# Patient Record
Sex: Female | Born: 1970
Health system: Southern US, Academic
[De-identification: ages and names within clinical notes are randomized; demographics above are authoritative.]

## PROBLEM LIST (undated history)

## (undated) ENCOUNTER — Encounter

## (undated) ENCOUNTER — Telehealth

## (undated) ENCOUNTER — Ambulatory Visit

## (undated) ENCOUNTER — Ambulatory Visit: Attending: Pharmacist | Primary: Pharmacist

## (undated) DIAGNOSIS — E785 Hyperlipidemia, unspecified: Secondary | ICD-10-CM

## (undated) DIAGNOSIS — G43909 Migraine, unspecified, not intractable, without status migrainosus: Secondary | ICD-10-CM

## (undated) DIAGNOSIS — T8859XA Other complications of anesthesia, initial encounter: Secondary | ICD-10-CM

## (undated) DIAGNOSIS — K219 Gastro-esophageal reflux disease without esophagitis: Secondary | ICD-10-CM

## (undated) DIAGNOSIS — M1990 Unspecified osteoarthritis, unspecified site: Secondary | ICD-10-CM

## (undated) DIAGNOSIS — M069 Rheumatoid arthritis, unspecified: Secondary | ICD-10-CM

## (undated) DIAGNOSIS — R569 Unspecified convulsions: Secondary | ICD-10-CM

## (undated) DIAGNOSIS — Z720 Tobacco use: Secondary | ICD-10-CM

## (undated) DIAGNOSIS — E039 Hypothyroidism, unspecified: Secondary | ICD-10-CM

## (undated) DIAGNOSIS — N83209 Unspecified ovarian cyst, unspecified side: Secondary | ICD-10-CM

## (undated) DIAGNOSIS — F429 Obsessive-compulsive disorder, unspecified: Secondary | ICD-10-CM

## (undated) HISTORY — PX: DIAGNOSTIC LAPAROSCOPY: SUR761

## (undated) HISTORY — PX: KNEE ARTHROSCOPY: SHX127

## (undated) HISTORY — PX: CRYOABLATION: SHX1415

## (undated) HISTORY — PX: REFRACTIVE SURGERY: SHX103

## (undated) HISTORY — PX: COLONOSCOPY: SHX174

## (undated) HISTORY — PX: SHOULDER SURGERY: SHX246

## (undated) HISTORY — PX: TONSILLECTOMY: SUR1361

## (undated) HISTORY — PX: ABDOMINAL HYSTERECTOMY: SHX81

## (undated) HISTORY — PX: ESOPHAGOGASTRODUODENOSCOPY: SHX1529

## (undated) HISTORY — PX: CHOLECYSTECTOMY: SHX55

---

## 2011-03-11 ENCOUNTER — Ambulatory Visit: Payer: Self-pay | Admitting: Neurology

## 2011-04-25 ENCOUNTER — Ambulatory Visit: Payer: Self-pay | Admitting: Neurology

## 2011-07-28 ENCOUNTER — Ambulatory Visit: Payer: Self-pay | Admitting: Unknown Physician Specialty

## 2012-07-13 ENCOUNTER — Ambulatory Visit: Payer: Self-pay

## 2012-08-07 ENCOUNTER — Ambulatory Visit: Payer: Self-pay | Admitting: Unknown Physician Specialty

## 2012-10-14 ENCOUNTER — Ambulatory Visit: Payer: Self-pay

## 2013-04-13 ENCOUNTER — Ambulatory Visit: Payer: Self-pay | Admitting: Family Medicine

## 2013-08-29 IMAGING — CT CT ABD-PELV W/ CM
1 of 2 series · 15 of 32 positions shown, 19 images · IV contrast (isovue)
Comparison: none

REASON FOR EXAM: Chronic Diarrhea  Abn Abd Xray
COMMENTS:

PROCEDURE:     KCT - KCT ABDOMEN/PELVIS W  - July 13, 2012  [DATE]
RESULT:     Technique: Helical 3 mm sections were obtained from the lung
bases through the superior iliac crest status post intravenous
administration of 85mL Isovue 370.

[Series 2: abd 3mm w 3.0 i40f 3 · axial · 0.82mm/px · z∈[-1029,-576]mm · 15 of 165 slices shown, 19 images]
[im 7/165  soft-tissue]
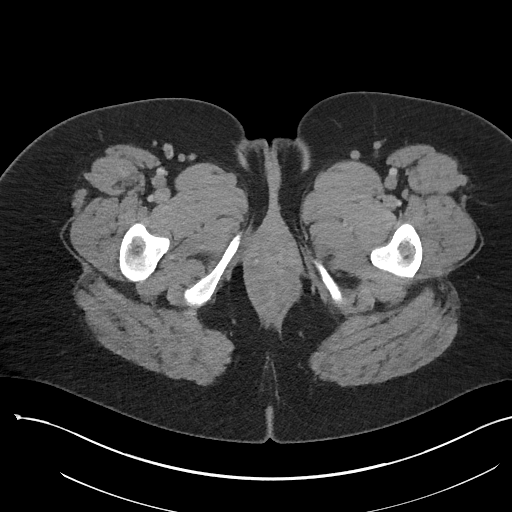
[im 7/165  bone]
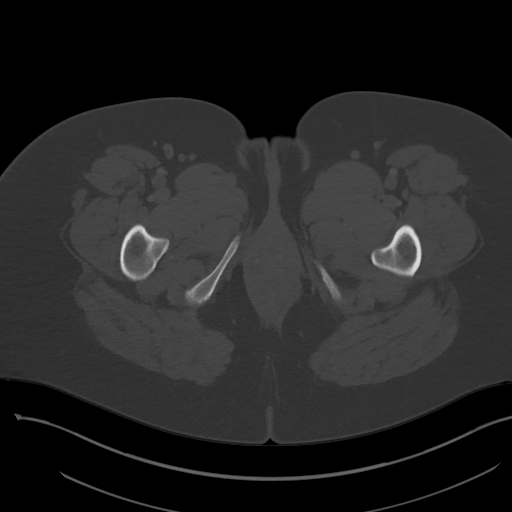
[im 19/165  soft-tissue]
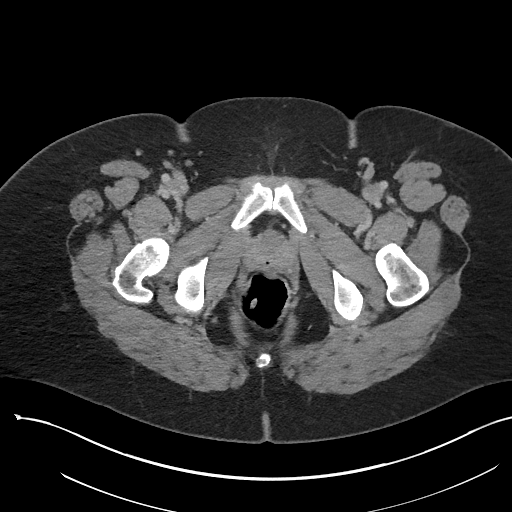
[im 32/165  soft-tissue]
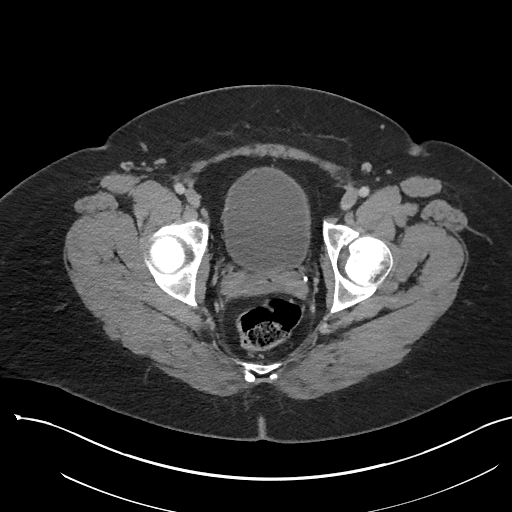
[im 45/165  soft-tissue]
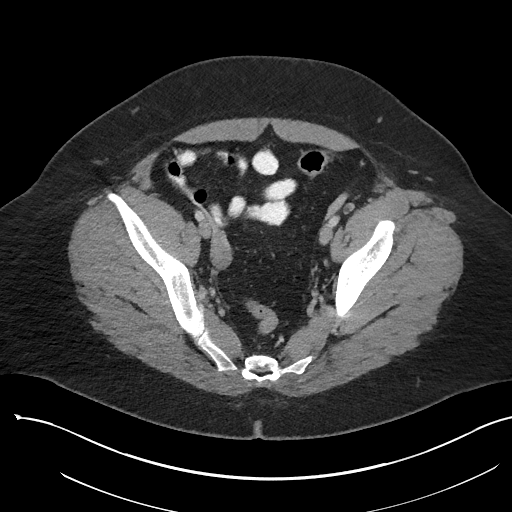
[im 57/165  soft-tissue]
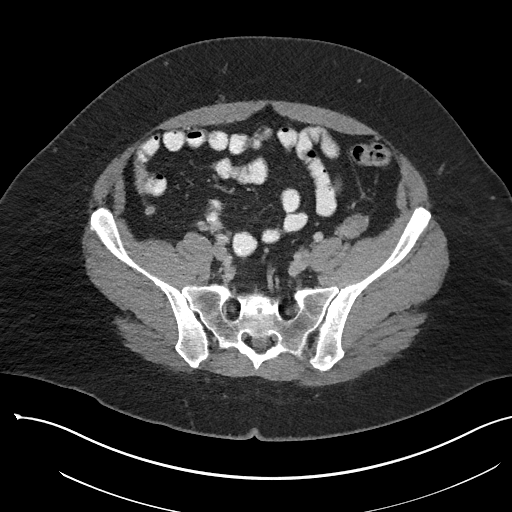
[im 70/165  soft-tissue]
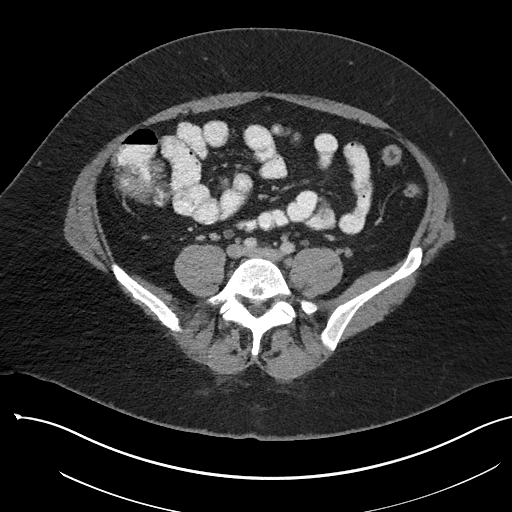
[im 83/165  soft-tissue]
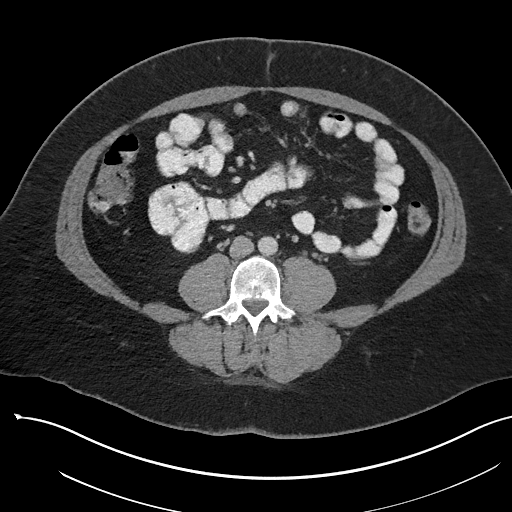
[im 95/165  soft-tissue]
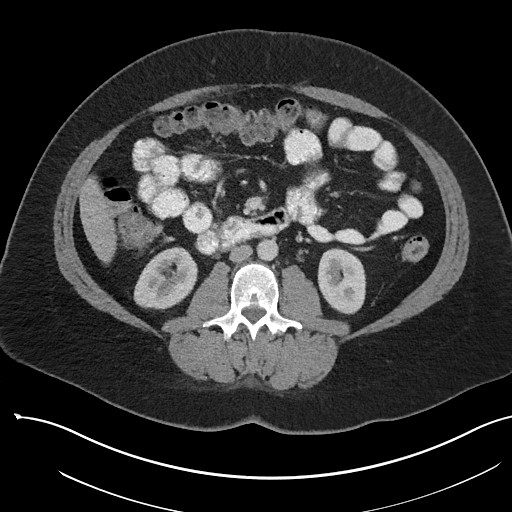
[im 108/165  soft-tissue]
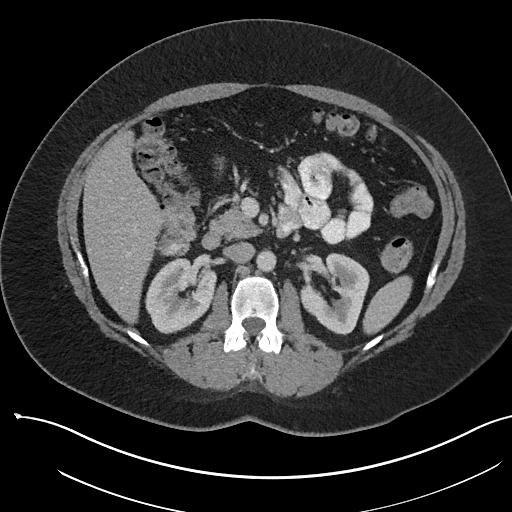
[im 108/165  bone]
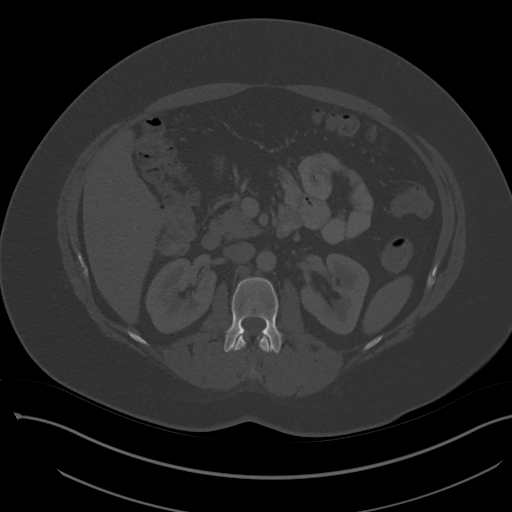
[im 120/165  soft-tissue]
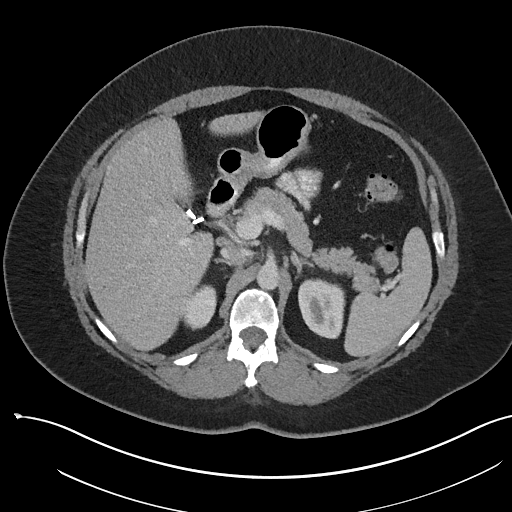
[im 133/165  soft-tissue]
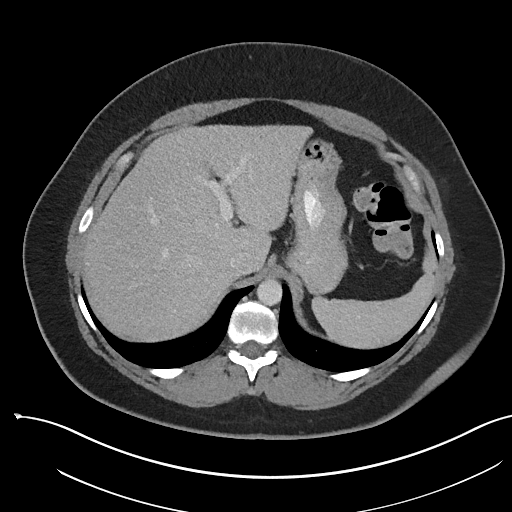
[im 139/165  lung]
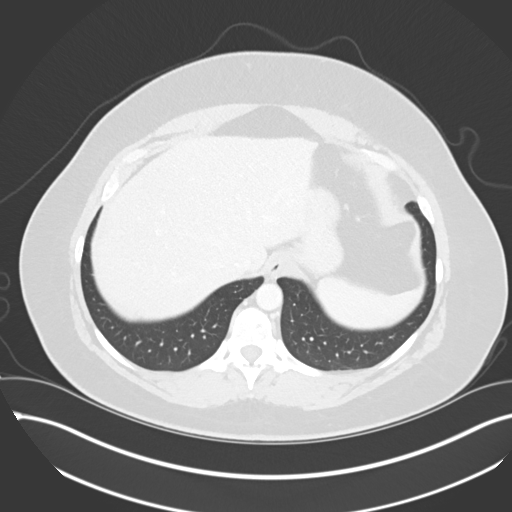
[im 146/165  soft-tissue]
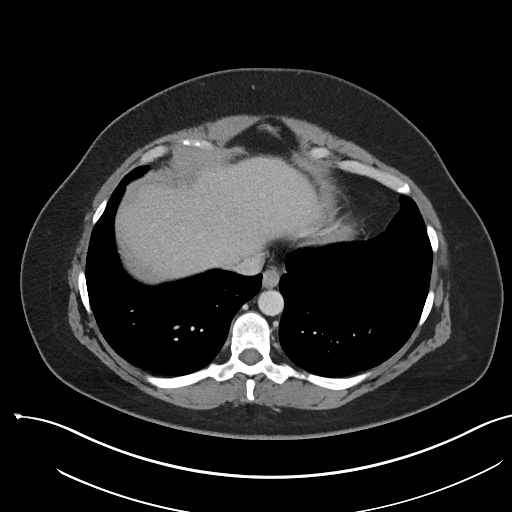
[im 146/165  lung]
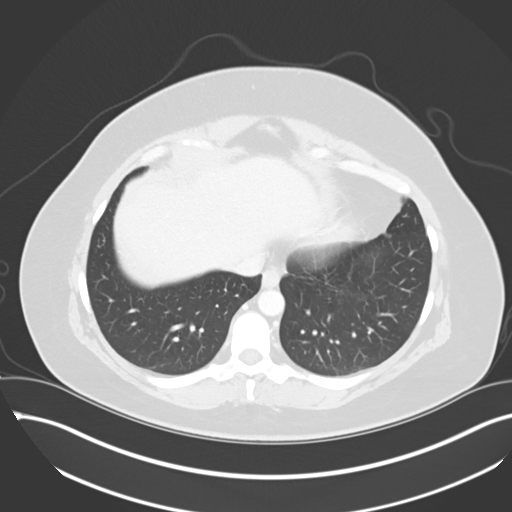
[im 152/165  lung]
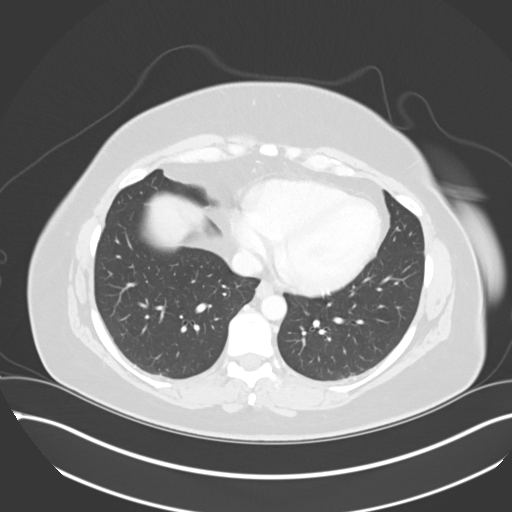
[im 158/165  soft-tissue]
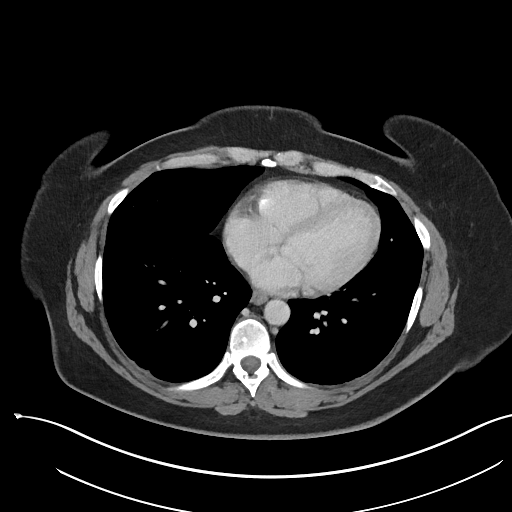
[im 158/165  lung]
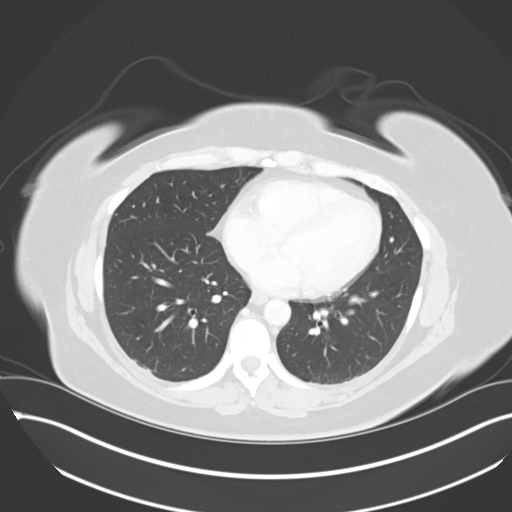

[15 of 32 positions shown; findings below may reference images not displayed]

FINDINGS: The lung bases are unremarkable.

The liver, spleen, adrenals, pancreas, and kidneys are unremarkable.

There is not evidence of bowel obstruction, enteritis, colitis nor
diverticulitis.

There is not evidence of an abdominal aortic aneurysm. The celiac, SMA, IMA,
portal vein, and SMV are opacified. The appendix is identified and is
unremarkable. Stomach is now completely decompressed and some wall
thickening cannot be excluded clinically warranted further evaluation with
direct visualization recommended.

Surgical clips are identified within the gallbladder fossa consistent with
prior cholecystectomy.

There is no evidence of abdominal free fluid, loculated fluid collections,
masses nor adenopathy.
IMPRESSION: No CT evidence of obstructive or inflammatory
abnormalities.

2. Findings within the stomach as described above.

## 2013-11-25 ENCOUNTER — Ambulatory Visit: Payer: Self-pay | Admitting: Family Medicine

## 2013-11-25 LAB — RAPID STREP-A WITH REFLX: Micro Text Report: NEGATIVE

## 2013-11-28 LAB — BETA STREP CULTURE(ARMC)

## 2022-03-07 MED ORDER — RINVOQ 15 MG TABLET,EXTENDED RELEASE
ORAL_TABLET | Freq: Every day | ORAL | 11 refills | 0 days
Start: 2022-03-07 — End: ?

## 2022-03-30 DIAGNOSIS — M0609 Rheumatoid arthritis without rheumatoid factor, multiple sites: Principal | ICD-10-CM

## 2022-04-01 NOTE — Unmapped (Signed)
[facility] Specialty Medication Onboarding    Specialty Medication: RINVOQ 15 mg Tb24 (upadacitinib)  Prior Authorization: Approved   Financial Assistance: Yes - copay card approved as secondary   Final Copay/Day Supply: $5 / 30    Insurance Restrictions: Yes - max 1 month supply     Notes to Pharmacist: n/a    The triage team has completed the benefits investigation and has determined that the patient is able to fill this medication at [facility]. Please contact the patient to complete the onboarding or follow up with the prescribing physician as needed.

## 2022-04-04 NOTE — Unmapped (Signed)
[facility]   Patient Onboarding/Medication Counseling    Roberta Bird is a 51 y.o. female with RA who I am counseling today on continuation of therapy.  I am speaking to the patient.    Was a Nurse, learning disability used for this call? No    Verified patient's date of birth / HIPAA.    Specialty medication(s) to be sent: Inflammatory Disorders: Rinvoq      Non-specialty medications/supplies to be sent: none at this time      Medications not needed at this time: n/a         Rinvoq (upadacitinib)    The patient declined counseling on medication administration, missed dose instructions, goals of therapy, side effects and monitoring parameters, warnings and precautions, drug/food interactions, and storage, handling precautions, and disposal because they have taken the medication previously. The information in the declined sections below are for informational purposes only and was not discussed with patient.       Medication & Administration     Dosage: Rheumatoid arthritis: Take 15mg  by mouth one time daily    Lab tests required prior to treatment initiation:  Tuberculosis: Tuberculosis screening resulted in a non-reactive Quantiferon TB Gold assay. (05/05/21)  Hepatitis B: Hepatitis B serology studies are not documented in the patient's chart but medication prescriber has indicated they are aware and wishing to initiate treatment at this time.  Absolute lymphocyte count: ALC greater than 500/mm3.  Absolute neutrophil count: ANC greater than 1,000/mm3.  Hemoglobin: Hemoglobin is greater than 8 g/dL.  Liver function tests: Baseline ALT and AST were evaluated and are documented in the patient chart prior to treatment initiation.  Pregnancy: Pregnancy status unconfirmed in patient chart but medication prescriber has indicated they are aware and wishing to initiate treatment at this time.      Administration: Take tablets with or without food.  Swallow tablets whole; do not chew, break, or crush.    Adherence/Missed dose instructions:  Take a missed dose as soon as you think about it.  If it has been more than 8-12 hours since your normal dosing time, skip the missed dose and go back to your normal time the following day.  Never take 2 doses at the same time or extra doses in an attempt to 'catch up' after a missed dose.    Goals of Therapy     Achieve symptom remission  Slow disease progression  Protection of remaining articular structures  Maintenance of function  Maintenance of effective psychosocial functioning    Side Effects & Monitoring Parameters     Upset stomach  Signs of a common cold - minor sore throat, runny or stuffy nose, etc.    The following side effects should be reported to the provider:  Signs of a hypersensitivity reaction - rash; hives; itching; red, swollen, blistered, or peeling skin; wheezing; tightness in the chest or throat; difficulty breathing, swallowing, or talking; swelling of the mouth, face, lips, tongue, or throat; etc.  Reduced immune function - report signs of infection such as fever; chills; body aches; very bad sore throat; ear or sinus pain; cough; more sputum or change in color of sputum; pain with passing urine; wound that will not heal, etc.  Also at a slightly higher risk of some malignancies (mainly skin and blood cancers) due to this reduced immune function.  A new skin growth or lump  Stomach pain that is new or worse or change in bowel habits  Signs of a blood clot -  chest pain or pressure; coughing up blood; shortness of breath; swelling, warmth, numbness, or pain in a leg or arm      Contraindications, Warnings, & Precautions     Have your bloodwork checked as you have been told by your prescriber  Talk with your doctor if you are pregnant, planning to become pregnant, or breastfeeding - women taking this medication must use birth control while taking this drug and for some time after the last dose  Discuss the possible need for holding your dose(s) of Rinvoq?? when a planned procedure is scheduled with the prescriber as it may delay healing/recovery timeline       Drug/Food Interactions     Medication list reviewed in Epic. The patient was instructed to inform the care team before taking any new medications or supplements. No drug interactions identified.   Talk with you prescriber or pharmacist before receiving any live vaccinations while taking this medication and after you stop taking it    Storage, Handling Precautions, & Disposal     Store in the original container at room temperature and protect from moisture.      Current Medications (including OTC/herbals), Comorbidities and Allergies     Current Outpatient Medications   Medication Sig Dispense Refill    upadacitinib (RINVOQ) 15 mg Tb24 Take 1 tablet (15 mg total) by mouth daily. 30 tablet 11     No current facility-administered medications for this visit.       Not on File    There is no problem list on file for this patient.      Reviewed and up to date in Epic.    Appropriateness of Therapy     Acute infections noted within Epic:  No active infections  Patient reported infection: None    Is medication and dose appropriate based on diagnosis and infection status? Yes    Prescription has been clinically reviewed: Yes      Baseline Quality of Life Assessment      How many days over the past month did your RA  keep you from your normal activities? For example, brushing your teeth or getting up in the morning. Patient declined to answer    Financial Information     Medication Assistance provided: Prior Authorization and Copay Assistance    Anticipated copay of $5 (30 days) reviewed with patient. Verified delivery address.    Delivery Information     Scheduled delivery date: 04/06/22    Expected start date: 04/08/22    Medication will be delivered via Next Day Courier to the prescription address in [facility].  This shipment will not require a signature.      Explained the services we provide at [facility] and that each month we would call to set up refills.  Stressed importance of returning phone calls so that we could ensure they receive their medications in time each month.  Informed patient that we should be setting up refills 7-10 days prior to when they will run out of medication.  A pharmacist will reach out to perform a clinical assessment periodically.  Informed patient that a welcome packet, containing information about our pharmacy and other support services, a Notice of Privacy Practices, and a drug information handout will be sent.      The patient or caregiver noted above participated in the development of this care plan and knows that they can request review of or adjustments to the care plan at any time.  Patient or caregiver verbalized understanding of the above information as well as how to contact the pharmacy at [phone] option 4 with any questions/concerns.  The pharmacy is open Monday through Friday 8:30am-4:30pm.  A pharmacist is available 24/7 via pager to answer any clinical questions they may have.    Patient Specific Needs     Does the patient have any physical, cognitive, or cultural barriers? No    Does the patient have adequate living arrangements? (i.e. the ability to store and take their medication appropriately) Yes    Did you identify any home environmental safety or security hazards? No    Patient prefers to have medications discussed with  Patient     Is the patient or caregiver able to read and understand education materials at a high school level or above? Yes    Patient's primary language is  English     Is the patient high risk? No    SOCIAL DETERMINANTS OF HEALTH     At the [facility], we have learned that life circumstances - like trouble affording food, housing, utilities, or transportation can affect the health of many of our patients.   That is why we wanted to ask: are you currently experiencing any life circumstances that are negatively impacting your health and/or quality of life? Patient declined to answer    Social Determinants of Health     Financial Resource Strain: Not on file   Internet Connectivity: Not on file   Food Insecurity: Not on file   Tobacco Use: Not on file   Housing/Utilities: Not on file   Alcohol Use: Not on file   Transportation Needs: Not on file   Substance Use: Not on file   Health Literacy: Not on file   Physical Activity: Not on file   Interpersonal Safety: Not on file   Stress: Not on file   Intimate Partner Violence: Not on file   Depression: Not on file   Social Connections: Not on file       Would you be willing to receive help with any of the needs that you have identified today? Not applicable       Camillo Flaming, PharmD  [facility] Specialty Pharmacist

## 2022-04-05 MED FILL — RINVOQ 15 MG TABLET,EXTENDED RELEASE: ORAL | 30 days supply | Qty: 30 | Fill #0

## 2022-04-25 DIAGNOSIS — U071 COVID-19: Secondary | ICD-10-CM

## 2022-04-25 HISTORY — DX: COVID-19: U07.1

## 2022-05-03 ENCOUNTER — Other Ambulatory Visit: Payer: Self-pay | Admitting: Podiatry

## 2022-05-03 NOTE — Unmapped (Signed)
[facility] Refill Coordination Note    Specialty Medication(s) to be Shipped:   Inflammatory Disorders: Rinvoq    Other medication(s) to be shipped: No additional medications requested for fill at this time     Roberta Bird, DOB: 26-Jul-1970  Phone: [phone] (home)       All above HIPAA information was verified with patient.     Was a Nurse, learning disability used for this call? No    Completed refill call assessment today to schedule patient's medication shipment from the [facility] [phone]).  All relevant notes have been reviewed.     Specialty medication(s) and dose(s) confirmed: Regimen is correct and unchanged.   Changes to medications: [place] reports no changes at this time.  Changes to insurance: No  New side effects reported not previously addressed with a pharmacist or physician: None reported  Questions for the pharmacist: No    Confirmed patient received a Conservation officer, historic buildings and a Surveyor, mining with first shipment. The patient will receive a drug information handout for each medication shipped and additional FDA Medication Guides as required.       DISEASE/MEDICATION-SPECIFIC INFORMATION        N/A    SPECIALTY MEDICATION ADHERENCE     Medication Adherence    Patient reported X missed doses in the last month: 0  Specialty Medication: Rinvoq  Patient is on additional specialty medications: No  Informant: patient                                Were doses missed due to medication being on hold? No    Rinvoq 15 mg: 4 days of medicine on hand       REFERRAL TO PHARMACIST     Referral to the pharmacist: Not needed      [facility]     Shipping address confirmed in Epic.     Delivery Scheduled: Yes, Expected medication delivery date: 1/18.     Medication will be delivered via Next Day Courier to the prescription address in Epic [facility].    Julianne Rice, PharmD   [facility] Specialty Pharmacist

## 2022-05-04 MED FILL — RINVOQ 15 MG TABLET,EXTENDED RELEASE: ORAL | 30 days supply | Qty: 30 | Fill #1

## 2022-05-11 ENCOUNTER — Encounter
Admission: RE | Admit: 2022-05-11 | Discharge: 2022-05-11 | Disposition: A | Discharge status: Home / Self Care | Payer: BC Managed Care – PPO | Source: Ambulatory Visit | Attending: Podiatry | Admitting: Podiatry

## 2022-05-11 HISTORY — DX: Other complications of anesthesia, initial encounter: T88.59XA

## 2022-05-11 HISTORY — DX: Obsessive-compulsive disorder, unspecified: F42.9

## 2022-05-11 HISTORY — DX: Rheumatoid arthritis, unspecified: M06.9

## 2022-05-11 HISTORY — DX: Unspecified ovarian cyst, unspecified side: N83.209

## 2022-05-11 HISTORY — DX: Tobacco use: Z72.0

## 2022-05-11 HISTORY — DX: Unspecified convulsions: R56.9

## 2022-05-11 HISTORY — DX: Hyperlipidemia, unspecified: E78.5

## 2022-05-11 HISTORY — DX: Gastro-esophageal reflux disease without esophagitis: K21.9

## 2022-05-11 HISTORY — DX: Unspecified osteoarthritis, unspecified site: M19.90

## 2022-05-11 HISTORY — DX: Hypothyroidism, unspecified: E03.9

## 2022-05-11 HISTORY — DX: Migraine, unspecified, not intractable, without status migrainosus: G43.909

## 2022-05-11 NOTE — Patient Instructions (Addendum)
Your procedure is scheduled on:05-20-22 Friday Report to the Registration Desk on the 1st floor of the [facility].Then proceed to the 2nd floor Surgery Desk To find out your arrival time, please call [phone] between 1PM - 3PM on:05-19-22 Thursday If your arrival time is 6:00 am, do not arrive prior to that time as the [facility] entrance doors do not open until 6:00 am.  REMEMBER: Instructions that are not followed completely may result in serious medical risk, up to and including death; or upon the discretion of your surgeon and anesthesiologist your surgery may need to be rescheduled.  Do not eat food after midnight the night before surgery.  No gum chewing, lozengers or hard candies.  You may however, drink CLEAR liquids up to 2 hours before you are scheduled to arrive for your surgery. Do not drink anything within 2 hours of your scheduled arrival time.  Clear liquids include: - water  - apple juice without pulp - gatorade (not RED colors) - black coffee or tea (Do NOT add milk or creamers to the coffee or tea) Do NOT drink anything that is not on this list  In addition, your doctor has ordered for you to drink the provided  Ensure Pre-Surgery Clear Carbohydrate Drink Drinking this carbohydrate drink up to two hours before surgery helps to reduce insulin resistance and improve patient outcomes. Please complete drinking 2 hours prior to scheduled arrival time.  TAKE THESE MEDICATIONS THE MORNING OF SURGERY WITH A SIP OF WATER: -levothyroxine (SYNTHROID)   One week prior to surgery: Stop Anti-inflammatories (NSAIDS) such as Advil, Aleve, Ibuprofen, Motrin, Naproxen, Naprosyn and Aspirin based products such as Excedrin, Goodys Powder, BC Powder.You may however, take Tylenol if needed for pain up until the day of surgery.  Stop ANY OVER THE COUNTER supplements/vitamins 7 days prior to surgery  No Alcohol for 24 hours before or after surgery.  No Smoking including e-cigarettes  for 24 hours prior to surgery.  No chewable tobacco products for at least 6 hours prior to surgery.  No nicotine patches on the day of surgery.  Do not use any "recreational" drugs for at least a week prior to your surgery.  Please be advised that the combination of cocaine and anesthesia may have negative outcomes, up to and including death. If you test positive for cocaine, your surgery will be cancelled.  On the morning of surgery brush your teeth with toothpaste and water, you may rinse your mouth with mouthwash if you wish. Do not swallow any toothpaste or mouthwash.  Use CHG Soap as directed on instruction sheet.  Do not wear jewelry, make-up, hairpins, clips or nail polish.  Do not wear lotions, powders, or perfumes.   Do not shave body from the neck down 48 hours prior to surgery just in case you cut yourself which could leave a site for infection.  Also, freshly shaved skin may become irritated if using the CHG soap.  Contact lenses, hearing aids and dentures may not be worn into surgery.  Do not bring valuables to the hospital. [facility] is not responsible for any missing/lost belongings or valuables.   Notify your doctor if there is any change in your medical condition (cold, fever, infection).  Wear comfortable clothing (specific to your surgery type) to the hospital.  After surgery, you can help prevent lung complications by doing breathing exercises.  Take deep breaths and cough every 1-2 hours. Your doctor may order a device called an Incentive Spirometer to help  you take deep breaths. When coughing or sneezing, hold a pillow firmly against your incision with both hands. This is called "splinting." Doing this helps protect your incision. It also decreases belly discomfort.  If you are being admitted to the hospital overnight, leave your suitcase in the car. After surgery it may be brought to your room.  If you are being discharged the day of surgery, you will not  be allowed to drive home. You will need a responsible adult (18 years or older) to drive you home and stay with you that night.   If you are taking public transportation, you will need to have a responsible adult (18 years or older) with you. Please confirm with your physician that it is acceptable to use public transportation.   Please call the [facility] at [phone] if you have any questions about these instructions.  Surgery Visitation Policy:  Patients undergoing a surgery or procedure may have two family members or support persons with them as long as the person is not COVID-19 positive or experiencing its symptoms.   Due to an increase in RSV and influenza rates and associated hospitalizations, children ages 80 and under will not be able to visit patients in [facility]. Masks continue to be strongly recommended.   How to Use an Incentive Spirometer An incentive spirometer is a tool that measures how well you are filling your lungs with each breath. Learning to take long, deep breaths using this tool can help you keep your lungs clear and active. This may help to reverse or lessen your chance of developing breathing (pulmonary) problems, especially infection. You may be asked to use a spirometer: After a surgery. If you have a lung problem or a history of smoking. After a long period of time when you have been unable to move or be active. If the spirometer includes an indicator to show the highest number that you have reached, your health care provider or respiratory therapist will help you set a goal. Keep a log of your progress as told by your health care provider. What are the risks? Breathing too quickly may cause dizziness or cause you to pass out. Take your time so you do not get dizzy or light-headed. If you are in pain, you may need to take pain medicine before doing incentive spirometry. It is harder to take a deep breath if you are having  pain. How to use your incentive spirometer  Sit up on the edge of your bed or on a chair. Hold the incentive spirometer so that it is in an upright position. Before you use the spirometer, breathe out normally. Place the mouthpiece in your mouth. Make sure your lips are closed tightly around it. Breathe in slowly and as deeply as you can through your mouth, causing the piston or the ball to rise toward the top of the chamber. Hold your breath for 3-5 seconds, or for as long as possible. If the spirometer includes a coach indicator, use this to guide you in breathing. Slow down your breathing if the indicator goes above the marked areas. Remove the mouthpiece from your mouth and breathe out normally. The piston or ball will return to the bottom of the chamber. Rest for a few seconds, then repeat the steps 10 or more times. Take your time and take a few normal breaths between deep breaths so that you do not get dizzy or light-headed. Do this every 1-2 hours when you are awake.  If the spirometer includes a goal marker to show the highest number you have reached (best effort), use this as a goal to work toward during each repetition. After each set of 10 deep breaths, cough a few times. This will help to make sure that your lungs are clear. If you have an incision on your chest or abdomen from surgery, place a pillow or a rolled-up towel firmly against the incision when you cough. This can help to reduce pain while taking deep breaths and coughing. General tips When you are able to get out of bed: Walk around often. Continue to take deep breaths and cough in order to clear your lungs. Keep using the incentive spirometer until your health care provider says it is okay to stop using it. If you have been in the hospital, you may be told to keep using the spirometer at home. Contact a health care provider if: You are having difficulty using the spirometer. You have trouble using the spirometer as  often as instructed. Your pain medicine is not giving enough relief for you to use the spirometer as told. You have a fever. Get help right away if: You develop shortness of breath. You develop a cough with bloody mucus from the lungs. You have fluid or blood coming from an incision site after you cough. Summary An incentive spirometer is a tool that can help you learn to take long, deep breaths to keep your lungs clear and active. You may be asked to use a spirometer after a surgery, if you have a lung problem or a history of smoking, or if you have been inactive for a long period of time. Use your incentive spirometer as instructed every 1-2 hours while you are awake. If you have an incision on your chest or abdomen, place a pillow or a rolled-up towel firmly against your incision when you cough. This will help to reduce pain. Get help right away if you have shortness of breath, you cough up bloody mucus, or blood comes from your incision when you cough. This information is not intended to replace advice given to you by your health care provider. Make sure you discuss any questions you have with your health care provider. Document Revised: 06/24/2019 Document Reviewed: 06/24/2019 Elsevier Patient Education  2023 [employer].

## 2022-05-20 ENCOUNTER — Ambulatory Visit: Payer: BC Managed Care – PPO | Admitting: Urgent Care

## 2022-05-20 ENCOUNTER — Ambulatory Visit: Payer: BC Managed Care – PPO

## 2022-05-20 ENCOUNTER — Ambulatory Visit
Admission: RE | Admit: 2022-05-20 | Discharge: 2022-05-20 | Disposition: A | Discharge status: Home / Self Care | Payer: BC Managed Care – PPO | Attending: Podiatry | Admitting: Podiatry

## 2022-05-20 ENCOUNTER — Other Ambulatory Visit: Payer: Self-pay

## 2022-05-20 ENCOUNTER — Encounter
Admission: RE | Disposition: A | Discharge status: Home / Self Care | Payer: Self-pay | Source: Home / Self Care | Attending: Podiatry

## 2022-05-20 ENCOUNTER — Encounter: Payer: Self-pay | Admitting: Podiatry

## 2022-05-20 DIAGNOSIS — F419 Anxiety disorder, unspecified: Secondary | ICD-10-CM | POA: Insufficient documentation

## 2022-05-20 DIAGNOSIS — Z6841 Body Mass Index (BMI) 40.0 and over, adult: Secondary | ICD-10-CM | POA: Insufficient documentation

## 2022-05-20 DIAGNOSIS — M7661 Achilles tendinitis, right leg: Secondary | ICD-10-CM | POA: Diagnosis present

## 2022-05-20 DIAGNOSIS — M0609 Rheumatoid arthritis without rheumatoid factor, multiple sites: Secondary | ICD-10-CM | POA: Diagnosis not present

## 2022-05-20 DIAGNOSIS — M1990 Unspecified osteoarthritis, unspecified site: Secondary | ICD-10-CM | POA: Insufficient documentation

## 2022-05-20 DIAGNOSIS — M899 Disorder of bone, unspecified: Secondary | ICD-10-CM | POA: Insufficient documentation

## 2022-05-20 DIAGNOSIS — Z87891 Personal history of nicotine dependence: Secondary | ICD-10-CM | POA: Insufficient documentation

## 2022-05-20 DIAGNOSIS — E039 Hypothyroidism, unspecified: Secondary | ICD-10-CM | POA: Insufficient documentation

## 2022-05-20 HISTORY — PX: ACHILLES TENDON SURGERY: SHX542

## 2022-05-20 HISTORY — PX: OSTECTOMY: SHX6439

## 2022-05-20 SURGERY — REPAIR, TENDON, ACHILLES
Anesthesia: General | Site: Foot | Laterality: Right

## 2022-05-20 MED ORDER — BUPIVACAINE-EPINEPHRINE 0.25% -1:200000 IJ SOLN
INTRAMUSCULAR | Status: DC | PRN
  Administered 2022-05-20: 10 mL

## 2022-05-20 MED ORDER — BUPIVACAINE HCL (PF) 0.25 % IJ SOLN
INTRAMUSCULAR | Status: AC
  Filled 2022-05-20: qty 60

## 2022-05-20 MED ORDER — PROPOFOL 10 MG/ML IV BOLUS
INTRAVENOUS | Status: AC
  Filled 2022-05-20: qty 20

## 2022-05-20 MED ORDER — 0.9 % SODIUM CHLORIDE (POUR BTL) OPTIME
TOPICAL | Status: DC | PRN
  Administered 2022-05-20: 1000 mL

## 2022-05-20 MED ORDER — MIDAZOLAM HCL 2 MG/2ML IJ SOLN
INTRAMUSCULAR | Status: AC
  Filled 2022-05-20: qty 2

## 2022-05-20 MED ORDER — EPINEPHRINE PF 1 MG/ML IJ SOLN
INTRAMUSCULAR | Status: AC
  Filled 2022-05-20: qty 1

## 2022-05-20 MED ORDER — OXYCODONE HCL 5 MG/5ML PO SOLN: 5.0000 mg | Freq: Once | ORAL | Status: DC | PRN

## 2022-05-20 MED ORDER — METOCLOPRAMIDE HCL 5 MG/ML IJ SOLN: 5.0000 mg | Freq: Three times a day (TID) | INTRAMUSCULAR | Status: DC | PRN

## 2022-05-20 MED ORDER — FAMOTIDINE 20 MG PO TABS
ORAL_TABLET | ORAL | Status: AC
  Administered 2022-05-20: 20 mg via ORAL
  Filled 2022-05-20: qty 1

## 2022-05-20 MED ORDER — OXYCODONE-ACETAMINOPHEN 5-325 MG PO TABS: 1.0000 | ORAL_TABLET | Freq: Four times a day (QID) | ORAL | 0 refills | Status: AC | PRN

## 2022-05-20 MED ORDER — FENTANYL CITRATE PF 50 MCG/ML IJ SOSY
PREFILLED_SYRINGE | INTRAMUSCULAR | Status: AC
  Administered 2022-05-20: 50 ug via INTRAVENOUS
  Filled 2022-05-20: qty 2

## 2022-05-20 MED ORDER — MIDAZOLAM HCL 2 MG/2ML IJ SOLN: 2.0000 mg | Freq: Once | INTRAMUSCULAR | Status: DC

## 2022-05-20 MED ORDER — FENTANYL CITRATE PF 50 MCG/ML IJ SOSY: 100.0000 ug | PREFILLED_SYRINGE | Freq: Once | INTRAMUSCULAR | Status: AC

## 2022-05-20 MED ORDER — LACTATED RINGERS IV SOLN: INTRAVENOUS | Status: DC

## 2022-05-20 MED ORDER — PROPOFOL 10 MG/ML IV BOLUS
INTRAVENOUS | Status: DC | PRN
  Administered 2022-05-20: 200 mg via INTRAVENOUS

## 2022-05-20 MED ORDER — STERILE WATER FOR INJECTION IJ SOLN
INTRAMUSCULAR | Status: AC
  Filled 2022-05-20: qty 10

## 2022-05-20 MED ORDER — DEXMEDETOMIDINE HCL IN NACL 200 MCG/50ML IV SOLN
INTRAVENOUS | Status: DC | PRN
  Administered 2022-05-20: 8 ug via INTRAVENOUS
  Administered 2022-05-20: 4 ug via INTRAVENOUS
  Administered 2022-05-20: 8 ug via INTRAVENOUS

## 2022-05-20 MED ORDER — CEFAZOLIN SODIUM-DEXTROSE 2-4 GM/100ML-% IV SOLN
2.0000 g | INTRAVENOUS | Status: AC
  Administered 2022-05-20: 2 g via INTRAVENOUS

## 2022-05-20 MED ORDER — ONDANSETRON HCL 4 MG PO TABS: 4.0000 mg | ORAL_TABLET | Freq: Four times a day (QID) | ORAL | Status: DC | PRN

## 2022-05-20 MED ORDER — LIDOCAINE HCL (PF) 2 % IJ SOLN
INTRAMUSCULAR | Status: AC
  Filled 2022-05-20: qty 5

## 2022-05-20 MED ORDER — BUPIVACAINE HCL (PF) 0.5 % IJ SOLN
INTRAMUSCULAR | Status: DC | PRN
  Administered 2022-05-20: 20 mL via PERINEURAL

## 2022-05-20 MED ORDER — METOCLOPRAMIDE HCL 10 MG PO TABS: 5.0000 mg | ORAL_TABLET | Freq: Three times a day (TID) | ORAL | Status: DC | PRN

## 2022-05-20 MED ORDER — MIDAZOLAM HCL 2 MG/2ML IJ SOLN
INTRAMUSCULAR | Status: DC | PRN
  Administered 2022-05-20: 2 mg via INTRAVENOUS

## 2022-05-20 MED ORDER — FENTANYL CITRATE (PF) 100 MCG/2ML IJ SOLN
INTRAMUSCULAR | Status: AC
  Filled 2022-05-20: qty 2

## 2022-05-20 MED ORDER — ACETAMINOPHEN 10 MG/ML IV SOLN: 1000.0000 mg | Freq: Once | INTRAVENOUS | Status: DC | PRN

## 2022-05-20 MED ORDER — CHLORHEXIDINE GLUCONATE 0.12 % MT SOLN
OROMUCOSAL | Status: AC
  Administered 2022-05-20: 15 mL via OROMUCOSAL
  Filled 2022-05-20: qty 15

## 2022-05-20 MED ORDER — ORAL CARE MOUTH RINSE: 15.0000 mL | Freq: Once | OROMUCOSAL | Status: AC

## 2022-05-20 MED ORDER — LIDOCAINE HCL (PF) 1 % IJ SOLN
INTRAMUSCULAR | Status: AC
  Filled 2022-05-20: qty 30

## 2022-05-20 MED ORDER — OXYCODONE HCL 5 MG PO TABS: 5.0000 mg | ORAL_TABLET | Freq: Once | ORAL | Status: DC | PRN

## 2022-05-20 MED ORDER — DOXYCYCLINE HYCLATE 100 MG PO CAPS: 100.0000 mg | ORAL_CAPSULE | Freq: Two times a day (BID) | ORAL | 0 refills | Status: AC

## 2022-05-20 MED ORDER — FENTANYL CITRATE (PF) 100 MCG/2ML IJ SOLN
INTRAMUSCULAR | Status: DC | PRN
  Administered 2022-05-20 (×2): 50 ug via INTRAVENOUS

## 2022-05-20 MED ORDER — CEFAZOLIN SODIUM-DEXTROSE 2-4 GM/100ML-% IV SOLN
INTRAVENOUS | Status: AC
  Filled 2022-05-20: qty 100

## 2022-05-20 MED ORDER — FENTANYL CITRATE (PF) 100 MCG/2ML IJ SOLN: 25.0000 ug | INTRAMUSCULAR | Status: DC | PRN

## 2022-05-20 MED ORDER — SUGAMMADEX SODIUM 500 MG/5ML IV SOLN
INTRAVENOUS | Status: DC | PRN
  Administered 2022-05-20: 300 mg via INTRAVENOUS

## 2022-05-20 MED ORDER — ROCURONIUM BROMIDE 10 MG/ML (PF) SYRINGE
PREFILLED_SYRINGE | INTRAVENOUS | Status: AC
  Filled 2022-05-20: qty 10

## 2022-05-20 MED ORDER — ONDANSETRON HCL 4 MG/2ML IJ SOLN
INTRAMUSCULAR | Status: DC | PRN
  Administered 2022-05-20: 4 mg via INTRAVENOUS

## 2022-05-20 MED ORDER — ROCURONIUM BROMIDE 100 MG/10ML IV SOLN
INTRAVENOUS | Status: DC | PRN
  Administered 2022-05-20: 50 mg via INTRAVENOUS

## 2022-05-20 MED ORDER — ONDANSETRON HCL 4 MG/2ML IJ SOLN: 4.0000 mg | Freq: Four times a day (QID) | INTRAMUSCULAR | Status: DC | PRN

## 2022-05-20 MED ORDER — DEXAMETHASONE SODIUM PHOSPHATE 10 MG/ML IJ SOLN
INTRAMUSCULAR | Status: AC
  Filled 2022-05-20: qty 1

## 2022-05-20 MED ORDER — DEXAMETHASONE SODIUM PHOSPHATE 4 MG/ML IJ SOLN
INTRAMUSCULAR | Status: DC | PRN
  Administered 2022-05-20: 2 mg via PERINEURAL

## 2022-05-20 MED ORDER — ONDANSETRON HCL 4 MG/2ML IJ SOLN
INTRAMUSCULAR | Status: AC
  Filled 2022-05-20: qty 2

## 2022-05-20 MED ORDER — ACETAMINOPHEN 10 MG/ML IV SOLN
INTRAVENOUS | Status: AC
  Filled 2022-05-20: qty 100

## 2022-05-20 MED ORDER — LIDOCAINE HCL (CARDIAC) PF 100 MG/5ML IV SOSY
PREFILLED_SYRINGE | INTRAVENOUS | Status: DC | PRN
  Administered 2022-05-20: 100 mg via INTRAVENOUS

## 2022-05-20 MED ORDER — CHLORHEXIDINE GLUCONATE 0.12 % MT SOLN: 15.0000 mL | Freq: Once | OROMUCOSAL | Status: AC

## 2022-05-20 MED ORDER — ONDANSETRON HCL 4 MG/2ML IJ SOLN: 4.0000 mg | Freq: Once | INTRAMUSCULAR | Status: DC | PRN

## 2022-05-20 MED ORDER — ACETAMINOPHEN 10 MG/ML IV SOLN
INTRAVENOUS | Status: DC | PRN
  Administered 2022-05-20: 1000 mg via INTRAVENOUS

## 2022-05-20 MED ORDER — FAMOTIDINE 20 MG PO TABS: 20.0000 mg | ORAL_TABLET | Freq: Once | ORAL | Status: AC

## 2022-05-20 SURGICAL SUPPLY — 71 items
ANCH SUT 4.5 FTPRNT PEEK-OPTM (Anchor) ×1 IMPLANT
ANCHOR 4.5 FOOTPRINT ULTRA (Anchor) IMPLANT
BIT DRILL 4X4.5 FOOTPRINT STR (BIT) IMPLANT
BLADE SURG 15 STRL LF DISP TIS (BLADE) ×1 IMPLANT
BLADE SURG 15 STRL SS (BLADE) ×3
BLADE SURG MINI STRL (BLADE) ×1 IMPLANT
BNDG CMPR 75X21 PLY HI ABS (MISCELLANEOUS)
BNDG CMPR STD VLCR NS LF 5.8X4 (GAUZE/BANDAGES/DRESSINGS) ×2
BNDG ELASTIC 4X5.8 VLCR NS LF (GAUZE/BANDAGES/DRESSINGS) ×2 IMPLANT
BNDG ESMARCH 4 X 12 STRL LF (GAUZE/BANDAGES/DRESSINGS) ×1
BNDG ESMARCH 4X12 STRL LF (GAUZE/BANDAGES/DRESSINGS) ×1 IMPLANT
BNDG ESMARCH 6 X 12 STRL LF (GAUZE/BANDAGES/DRESSINGS) ×1
BNDG ESMARCH 6X12 STRL LF (GAUZE/BANDAGES/DRESSINGS) ×1 IMPLANT
BNDG GAUZE DERMACEA FLUFF 4 (GAUZE/BANDAGES/DRESSINGS) ×1 IMPLANT
BNDG GZE 12X3 1 PLY HI ABS (GAUZE/BANDAGES/DRESSINGS) ×1
BNDG GZE DERMACEA 4 6PLY (GAUZE/BANDAGES/DRESSINGS) ×1
BNDG STRETCH GAUZE 3IN X12FT (GAUZE/BANDAGES/DRESSINGS) ×1 IMPLANT
BOOT STEPPER DURA MED (SOFTGOODS) IMPLANT
DRAPE FLUOR MINI C-ARM 54X84 (DRAPES) ×1 IMPLANT
DRILL 4X4.5 FOOTPRINT STR (BIT) ×1
DURAPREP 26ML APPLICATOR (WOUND CARE) ×1 IMPLANT
ELECT REM PT RETURN 9FT ADLT (ELECTROSURGICAL) ×1
ELECTRODE REM PT RTRN 9FT ADLT (ELECTROSURGICAL) ×1 IMPLANT
GAUZE SPONGE 4X4 12PLY STRL (GAUZE/BANDAGES/DRESSINGS) ×1 IMPLANT
GAUZE STRETCH 2X75IN STRL (MISCELLANEOUS) ×1 IMPLANT
GAUZE XEROFORM 1X8 LF (GAUZE/BANDAGES/DRESSINGS) ×1 IMPLANT
GLOVE BIO SURGEON STRL SZ7.5 (GLOVE) ×1 IMPLANT
GLOVE SURG UNDER LTX SZ8 (GLOVE) ×1 IMPLANT
GOWN STRL REUS W/ TWL XL LVL3 (GOWN DISPOSABLE) ×2 IMPLANT
GOWN STRL REUS W/TWL XL LVL3 (GOWN DISPOSABLE) ×2
HANDLE YANKAUER SUCT BULB TIP (MISCELLANEOUS) ×1 IMPLANT
IV NS 500ML (IV SOLUTION) ×1
IV NS 500ML BAXH (IV SOLUTION) ×1 IMPLANT
KIT TURNOVER KIT A (KITS) ×1 IMPLANT
LABEL OR SOLS (LABEL) IMPLANT
MANIFOLD NEPTUNE II (INSTRUMENTS) ×1 IMPLANT
NDL FILTER BLUNT 18X1 1/2 (NEEDLE) ×1 IMPLANT
NDL HYPO 18GX1.5 BLUNT FILL (NEEDLE) ×1 IMPLANT
NDL HYPO 25X1 1.5 SAFETY (NEEDLE) ×3 IMPLANT
NDL MAYO CATGUT SZ5 (NEEDLE) ×1
NDL SUT 5 .5 CRC TPR PNT MAYO (NEEDLE) ×1 IMPLANT
NEEDLE FILTER BLUNT 18X1 1/2 (NEEDLE) ×1 IMPLANT
NEEDLE HYPO 18GX1.5 BLUNT FILL (NEEDLE) ×1 IMPLANT
NEEDLE HYPO 25X1 1.5 SAFETY (NEEDLE) ×3 IMPLANT
NS IRRIG 500ML POUR BTL (IV SOLUTION) ×1 IMPLANT
PACK EXTREMITY ARMC (MISCELLANEOUS) ×1 IMPLANT
RASP SM TEAR CROSS CUT (RASP) IMPLANT
SPLINT CAST 1 STEP 5X30 WHT (MISCELLANEOUS) ×1 IMPLANT
SPLINT PLASTER CAST FAST 5X30 (CAST SUPPLIES) ×1 IMPLANT
SPONGE T-LAP 18X18 ~~LOC~~+RFID (SPONGE) ×1 IMPLANT
STOCKINETTE IMPERVIOUS 9X36 MD (GAUZE/BANDAGES/DRESSINGS) ×1 IMPLANT
STOCKINETTE M/LG 89821 (MISCELLANEOUS) ×1 IMPLANT
STRIP CLOSURE SKIN 1/2X4 (GAUZE/BANDAGES/DRESSINGS) ×1 IMPLANT
SUT MNCRL 4-0 (SUTURE) ×1
SUT MNCRL 4-0 27XMFL (SUTURE) ×1
SUT PDS AB 0 CT1 27 (SUTURE) IMPLANT
SUT ULTRABRAID #2 38 (SUTURE) ×1 IMPLANT
SUT VIC AB 0 SH 27 (SUTURE) ×1 IMPLANT
SUT VIC AB 2-0 SH 27 (SUTURE) ×2
SUT VIC AB 2-0 SH 27XBRD (SUTURE) ×2 IMPLANT
SUT VIC AB 3-0 SH 27 (SUTURE) ×4
SUT VIC AB 3-0 SH 27X BRD (SUTURE) ×1 IMPLANT
SUT VIC AB 4-0 FS2 27 (SUTURE) ×1 IMPLANT
SUT VICRYL AB 3-0 FS1 BRD 27IN (SUTURE) ×1 IMPLANT
SUTURE MNCRL 4-0 27XMF (SUTURE) ×1 IMPLANT
SWABSTK COMLB BENZOIN TINCTURE (MISCELLANEOUS) ×1 IMPLANT
SYR 10ML LL (SYRINGE) ×2 IMPLANT
SYR 3ML LL SCALE MARK (SYRINGE) ×1 IMPLANT
TRAP FLUID SMOKE EVACUATOR (MISCELLANEOUS) ×1 IMPLANT
WAND TOPAZ MICRO DEBRIDER (MISCELLANEOUS) IMPLANT
WATER STERILE IRR 500ML POUR (IV SOLUTION) ×1 IMPLANT

## 2022-05-20 NOTE — Discharge Instructions (Addendum)
AMBULATORY SURGERY  DISCHARGE INSTRUCTIONS   The drugs that you were given will stay in your system until tomorrow so for the next 24 hours you should not:  Drive an automobile Make any legal decisions Drink any alcoholic beverage   You may resume regular meals tomorrow.  Today it is better to start with liquids and gradually work up to solid foods.  You may eat anything you prefer, but it is better to start with liquids, then soup and crackers, and gradually work up to solid foods.   Please notify your doctor immediately if you have any unusual bleeding, trouble breathing, redness and pain at the surgery site, drainage, fever, or pain not relieved by medication.    Additional Instructions:        Please contact your physician with any problems or Same Day Surgery at [phone], Monday through Friday 6 am to 4 pm, or [place] [facility] number at [phone].[facility]  POST OPERATIVE INSTRUCTIONS FOR DR. FOWLER AND [facility] PODIATRY DEPARTMENT   Take your medication as prescribed.  Pain medication should be taken only as needed.  Keep the dressing clean, dry and intact.  Keep your foot elevated above the heart level for the first 48 hours.  We have instructed you to be non-weight bearing.  Always wear your post-op shoe when walking.  Always use your crutches if you are to be non-weight bearing.  Do not take a shower. Baths are permissible as long as the foot is kept out of the water.   Every hour you are awake:  Bend your knee 15 times.  Call [facility] ([phone]) if any of the following problems occur: You develop a temperature or fever. The bandage becomes saturated with blood. Medication does not stop your pain. Injury of the foot occurs. Any symptoms of infection including redness, odor, or red streaks running from wound. 
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
## 2022-05-20 NOTE — H&P (Signed)
HISTORY AND PHYSICAL INTERVAL NOTE:  05/20/2022  7:28 AM  Wendy Salas  has presented today for surgery, with the diagnosis of M76.61 - Achilles tendinitis of right lower extremity. M06.09 - Rheumatoid arthritis of multiple sites with negative rheumatoid factor.  The various methods of treatment have been discussed with the patient.  No guarantees were given.  After consideration of risks, benefits and other options for treatment, the patient has consented to surgery.  I have reviewed the patients' chart and labs.     A history and physical examination was performed in my office.  The patient was reexamined.  There have been no changes to this history and physical examination.  [name] A

## 2022-05-20 NOTE — Anesthesia Postprocedure Evaluation (Signed)
Anesthesia Post Note  Patient: Wendy Salas  Procedure(s) Performed: ACHILLES TENDON REPAIR (Right: Foot) OSTECTOMY - HAGLUNDS RETROCALCANEAL (Right: Foot)  Patient location during evaluation: PACU Anesthesia Type: General Level of consciousness: awake and alert Pain management: pain level controlled Vital Signs Assessment: post-procedure vital signs reviewed and stable Respiratory status: spontaneous breathing, nonlabored ventilation, respiratory function stable and patient connected to nasal cannula oxygen Cardiovascular status: blood pressure returned to baseline and stable Postop Assessment: no apparent nausea or vomiting Anesthetic complications: no   No notable events documented.   Last Vitals:  Vitals:   05/20/22 1000 05/20/22 1013  BP: 120/83 (!) 141/85  Pulse: 82 83  Resp: 10 16  Temp: (!) 36.4 C (!) 36.2 C  SpO2: 93% 97%    Last Pain:  Vitals:   05/20/22 1013  TempSrc: Temporal  PainSc: 0-No pain                 Arita Miss

## 2022-05-20 NOTE — Op Note (Signed)
Operative note   Surgeon:Colston Pyle Lawyer: None    Preop diagnosis: 1.  Right posterior Achilles tendinosis 2.  Right calcaneal exostosis    Postop diagnosis: Same    Procedure: 1.  Right posterior Achilles insertional repair 2.  Right calcaneal exostectomy 3.  Intraoperative fluoroscopy without assistance of radiologist    EBL: Minimal    Anesthesia:regional and general a popliteal block was placed preoperatively.    Hemostasis: 0.25% bupivacaine with epinephrine was infiltrated along the incision site.  A total of 10 cc was used    Specimen: Achilles tendinosis    Complications: None    Operative indications:Wendy Salas is an 52 y.o. that presents today for surgical intervention.  The risks/benefits/alternatives/complications have been discussed and consent has been given.    Procedure:  Patient was brought into the OR and placed on the operating table in theprone position. After anesthesia was obtained theright lower extremity was prepped and draped in usual sterile fashion.  Attention was directed to the posterior aspect of the right heel at the Achilles insertional site where a longitudinal incision was performed.  Sharp and blunt dissection carried down to the peritenon.  This was incised and tagged for later reanastomosis.  A tendon splitting incision was performed.  The medial and lateral aspect of the calcaneus was removed of the Achilles tendon.  Fibrotic tissue was noted and removed from the intratendinous region as well as any intratendinous calcification was removed.  Debulking of the tendon was performed at this time.  Next the posterior calcaneal exostosis was noted.  With a combination of a power rasp and osteotome I was able to remove a large amount of posterior calcaneal exostosis down to good healthy bone.  Recontouring of the posterior superior aspect of the calcaneus was also performed at the Haglund's region.  At this time the wound was flushed with  copious amounts of irrigation.  The tendon was then infiltrated with a Topaz wand.  Next the tendon splitting incision was reanastomosed within the tendon with a 3-0 Vicryl.  Next a #2 Magnum wire was used and a Krakw suture type along the Achilles tendon.  The tendon was then tacked into the posterior calcaneus with a 5.0 mm footprint bone anchor.  Good plantarflexion of the foot was noticed with good squeezing of the calf.  The wound was flushed 1 final time and the peritenon was then closed with 3-0 Vicryl as well as the subcutaneous tissue.  The skin closed with a 4-0 Monocryl.  Patient was placed in an equalizer walker boot for stabilization.    Patient tolerated the procedure and anesthesia well.  Was transported from the OR to the PACU with all vital signs stable and vascular status intact. To be discharged per routine protocol.  Will follow up in approximately 1 week in the outpatient clinic.

## 2022-05-20 NOTE — Transfer of Care (Signed)
Immediate Anesthesia Transfer of Care Note  Patient: Wendy Salas  Procedure(s) Performed: ACHILLES TENDON REPAIR (Right: Foot) OSTECTOMY - HAGLUNDS RETROCALCANEAL (Right: Foot)  Patient Location: PACU  Anesthesia Type:General  Level of Consciousness: awake, alert , and oriented  Airway & Oxygen Therapy: Patient Spontanous Breathing and Patient connected to face mask oxygen  Post-op Assessment: Report given to RN and Post -op Vital signs reviewed and stable  Post vital signs: stable  Last Vitals:  Vitals Value Taken Time  BP 123/84 05/20/22 0930  Temp    Pulse 81 05/20/22 0932  Resp 18 05/20/22 0932  SpO2 97 % 05/20/22 0932  Vitals shown include unvalidated device data.  Last Pain:  Vitals:   05/20/22 0735  TempSrc:   PainSc: 0-No pain         Complications: No notable events documented.

## 2022-05-20 NOTE — Anesthesia Procedure Notes (Signed)
Procedure Name: Intubation Date/Time: 05/20/2022 7:49 AM  Performed by: Natasha Mead, CRNAPre-anesthesia Checklist: Patient identified, Emergency Drugs available, Suction available and Patient being monitored Patient Re-evaluated:Patient Re-evaluated prior to induction Oxygen Delivery Method: Circle system utilized Preoxygenation: Pre-oxygenation with 100% oxygen Induction Type: IV induction Ventilation: Mask ventilation without difficulty Laryngoscope Size: McGraph and 3 Grade View: Grade I Tube type: Oral Tube size: 7.0 mm Number of attempts: 1 Airway Equipment and Method: Stylet and Oral airway Placement Confirmation: ETT inserted through vocal cords under direct vision, positive ETCO2 and breath sounds checked- equal and bilateral Secured at: 20 cm Tube secured with: Tape Dental Injury: Teeth and Oropharynx as per pre-operative assessment

## 2022-05-20 NOTE — Anesthesia Procedure Notes (Signed)
Anesthesia Regional Block: Popliteal block   Pre-Anesthetic Checklist: , timeout performed,  Correct Patient, Correct Site, Correct Laterality,  Correct Procedure, Correct Position, site marked,  Risks and benefits discussed,  Surgical consent,  Pre-op evaluation,  At surgeon's request and post-op pain management  Laterality: Lower and Right  Prep: chloraprep       Needles:  Injection technique: Single-shot  Needle Type: Echogenic Needle     Needle Length: 9cm  Needle Gauge: 21     Additional Needles:   Procedures:,,,, ultrasound used (permanent image in chart),,    Narrative:  Injection made incrementally with aspirations every 5 mL.  Performed by: Personally  Anesthesiologist: Arita Miss, MD  Additional Notes: Patient's chart reviewed and they were deemed appropriate candidate for procedure, at surgeon's request. Patient educated about risks, benefits, and alternatives of the block including but not limited to: temporary or permanent nerve damage, bleeding, infection, damage to surround tissues, block failure, local anesthetic toxicity. Patient expressed understanding. A formal time-out was conducted consistent with institution rules.  Monitors were applied, and minimal sedation used. The site was prepped with skin prep and allowed to dry, and sterile gloves were used. A high frequency linear ultrasound probe with probe cover was utilized throughout. Popliteal artery pulsatile and visualized in popliteal fossa along with adjacent sciatic nerve and its branch point, which appeared anatomically normal, local anesthetic injected around them just proximal to the branch point, and echogenic block needle trajectory was monitored throughout. Aspiration performed every 50ml. Blood vessels were avoided. All injections were performed without resistance and free of blood and paresthesias. The patient tolerated the procedure well.  Injectate: 61ml Bupivacaine 0.3% + 2mg  decadron

## 2022-05-23 LAB — SURGICAL PATHOLOGY

## 2022-05-25 NOTE — Unmapped (Signed)
[facility] Refill Coordination Note    Specialty Medication(s) to be Shipped:   Inflammatory Disorders: Rinvoq    Other medication(s) to be shipped: No additional medications requested for fill at this time     Dicie Pippert, DOB: 1971-02-25  Phone: [phone] (home)       All above HIPAA information was verified with patient.     Was a Nurse, learning disability used for this call? No    Completed refill call assessment today to schedule patient's medication shipment from the [facility] [phone]).  All relevant notes have been reviewed.     Specialty medication(s) and dose(s) confirmed: Regimen is correct and unchanged.   Changes to medications: Annalena reports no changes at this time.  Changes to insurance: No  New side effects reported not previously addressed with a pharmacist or physician: None reported  Questions for the pharmacist: No    Confirmed patient received a Conservation officer, historic buildings and a Surveyor, mining with first shipment. The patient will receive a drug information handout for each medication shipped and additional FDA Medication Guides as required.       DISEASE/MEDICATION-SPECIFIC INFORMATION        N/A    SPECIALTY MEDICATION ADHERENCE              Were doses missed due to medication being on hold? Yes - Patient had surgery on Friday, medication is to be held until they can evaluate her wound to state no infection.     Rinvoq 15 mg: 10 days of medicine on hand       REFERRAL TO PHARMACIST     Referral to the pharmacist: Not needed      [facility]     Shipping address confirmed in Epic.     Delivery Scheduled: Yes, Expected medication delivery date: 06/01/22.     Medication will be delivered via UPS to the prescription address in Epic [place].    Sherral Hammers, PharmD   [facility] Specialty Pharmacist

## 2022-06-01 MED FILL — RINVOQ 15 MG TABLET,EXTENDED RELEASE: ORAL | 30 days supply | Qty: 30 | Fill #2

## 2022-06-23 NOTE — Unmapped (Signed)
Roberta Bird has been contacted in regards to their refill of Rinvoq. At this time, they have declined refill due to medication being on hold patient had surgery. Refill assessment call date has been updated per the patient's request. Patient was unsure of when she was due and said she would happily call us with a week left.     Roberta Bird, BS PharmD - Pharmacist - [facility]   [address] [place], [place] [address]   Phone: [phone] Ext [phone]- Fax. [phone]

## 2022-07-08 NOTE — Unmapped (Signed)
[facility] Special[facility] Pharm[facility] Refill Coordination Note    Specialty Medication(s) to be Shipped:   Inflammatory Disorders: Rinvoq    Other medication(s) to be shipped: No additional medications requested for fill at this time     Roberta Bird, DOB: [birth year]  Phone: [phone] (home)       All above HIPAA information was verified with patient.     Was a Nurse, learning disability used for this call? No    Completed refill call assessment today to schedule patient's medication shipment from the [facility] [phone]).  All relevant notes have been reviewed.     Specialty medication(s) and dose(s) confirmed: Regimen is correct and unchanged.   Changes to medications: Roberta Bird reports stopping the following medications: Plaquenil  Changes to insurance: No  New side effects reported not previously addressed with a pharmacist or physician: None reported  Questions for the pharmacist: No    Confirmed patient received a Conservation officer, historic buildings and a Surveyor, mining with first shipment. The patient will receive a drug information handout for each medication shipped and additional FDA Medication Guides as required.       DISEASE/MEDICATION-SPECIFIC INFORMATION        N/A    SPECIALTY MEDICATION ADHERENCE     Medication Adherence    Patient reported X missed doses in the last month: 0  Specialty Medication: Rinvoq 15 mg  Patient is on additional specialty medications: No  Informant: patient     Were doses missed due to medication being on hold? No    Rinvoq 15 mg: 9 days of medicine on hand       REFERRAL TO PHARMACIST     Referral to the pharmacist: Not needed      [facility]     Shipping address confirmed in Epic.     Patient was notified of new phone menu : Yes    Delivery Scheduled: Yes, Expected medication delivery date: 07/13/22.     Medication will be delivered via Next Day Courier to the prescription address in Epic [place].    Roberta Bird M Elisabeth Cara   [facility] Specialty Technician

## 2022-07-12 MED FILL — RINVOQ 15 MG TABLET,EXTENDED RELEASE: ORAL | 30 days supply | Qty: 30 | Fill #3

## 2022-08-02 NOTE — Unmapped (Signed)
[facility] Pharm[facility] Refill Coordination Note    Specialty Medication(s) to be Shipped:   Inflammatory Disorders: Rinvoq    Other medication(s) to be shipped: No additional medications requested for fill at this time     Roberta Bird, DOB: [birth year]  Phone: [phone] (home)       All above HIPAA information was verified with patient.     Was a Nurse, learning disability used for this call? No    Completed refill call assessment today to schedule patient's medication shipment from the [facility] [phone]).  All relevant notes have been reviewed.     Specialty medication(s) and dose(s) confirmed: Regimen is correct and unchanged.   Changes to medications: Roberta Bird reports no changes at this time.  Changes to insurance: No  New side effects reported not previously addressed with a pharmacist or physician: None reported  Questions for the pharmacist: No    Confirmed patient received a Conservation officer, historic buildings and a Surveyor, mining with first shipment. The patient will receive a drug information handout for each medication shipped and additional FDA Medication Guides as required.       DISEASE/MEDICATION-SPECIFIC INFORMATION        N/A    SPECIALTY MEDICATION ADHERENCE     Medication Adherence    Patient reported X missed doses in the last month: 0  Specialty Medication: RINVOQ 15 mg Tb24 (upadacitinib)  Patient is on additional specialty medications: No  Patient is on more than two specialty medications: No  Any gaps in refill history greater than 2 weeks in the last 3 months: no  Demonstrates understanding of importance of adherence: yes  Informant: patient  Confirmed plan for next specialty medication refill: delivery by pharmacy  Refills needed for supportive medications: not needed          Refill Coordination    Has the Patients' Contact Information Changed: No  Is the Shipping Address Different: No         Were doses missed due to medication being on hold? No    RINVOQ 15  mg: 10 days of medicine on hand       REFERRAL TO PHARMACIST     Referral to the pharmacist: Not needed      [facility]     Shipping address confirmed in Epic.       Delivery Scheduled: Yes, Expected medication delivery date: 08/09/2022.     Medication will be delivered via Next Day Courier to the prescription address in Epic [facility].    Roberta Bird   [facility] Specialty Technician

## 2022-08-08 MED FILL — RINVOQ 15 MG TABLET,EXTENDED RELEASE: ORAL | 30 days supply | Qty: 30 | Fill #4

## 2022-09-08 NOTE — Unmapped (Signed)
[facility] Refill Coordination Note    Specialty Medication(s) to be Shipped:   Inflammatory Disorders: Rinvoq    Other medication(s) to be shipped: No additional medications requested for fill at this time     Roberta Bird, DOB: November 08, 1970  Phone: [phone] (home)       All above HIPAA information was verified with patient.     Was a Nurse, learning disability used for this call? No    Completed refill call assessment today to schedule patient's medication shipment from the [facility] [phone]).  All relevant notes have been reviewed.     Specialty medication(s) and dose(s) confirmed: Regimen is correct and unchanged.   Changes to medications: Radonna reports no changes at this time.  Changes to insurance: No  New side effects reported not previously addressed with a pharmacist or physician: None reported  Questions for the pharmacist: No    Confirmed patient received a Conservation officer, historic buildings and a Surveyor, mining with first shipment. The patient will receive a drug information handout for each medication shipped and additional FDA Medication Guides as required.       DISEASE/MEDICATION-SPECIFIC INFORMATION        N/A    SPECIALTY MEDICATION ADHERENCE     Medication Adherence    Patient reported X missed doses in the last month: 0  Specialty Medication: RINVOQ 15 mg Tb24 (upadacitinib)  Patient is on additional specialty medications: No              Were doses missed due to medication being on hold? No    Rinvoq 15 mg: 7 days of medicine on hand        REFERRAL TO PHARMACIST     Referral to the pharmacist: Not needed      [facility]     Shipping address confirmed in Epic.       Delivery Scheduled: Yes, Expected medication delivery date: 09/14/22.     Medication will be delivered via Next Day Courier to the prescription address in Epic [facility].    Roberta Bird   [facility] Specialty Technician

## 2022-09-12 MED FILL — RINVOQ 15 MG TABLET,EXTENDED RELEASE: ORAL | 30 days supply | Qty: 30 | Fill #5

## 2022-10-06 NOTE — Unmapped (Signed)
[facility] Refill Coordination Note    Specialty Medication(s) to be Shipped:   Inflammatory Disorders: Rinvoq    Other medication(s) to be shipped: No additional medications requested for fill at this time     Roberta Bird, DOB: Nov 16, 1970  Phone: [phone] (home)       All above HIPAA information was verified with patient.     Was a Nurse, learning disability used for this call? No    Completed refill call assessment today to schedule patient's medication shipment from the [facility] [phone]).  All relevant notes have been reviewed.     Specialty medication(s) and dose(s) confirmed: Regimen is correct and unchanged.   Changes to medications: Roberta Bird reports no changes at this time.  Changes to insurance: No  New side effects reported not previously addressed with a pharmacist or physician: None reported  Questions for the pharmacist: No    Confirmed patient received a Conservation officer, historic buildings and a Surveyor, mining with first shipment. The patient will receive a drug information handout for each medication shipped and additional FDA Medication Guides as required.       DISEASE/MEDICATION-SPECIFIC INFORMATION        N/A    SPECIALTY MEDICATION ADHERENCE              Were doses missed due to medication being on hold? No    Rinvoq 15 mg: 14 days of medicine on hand       REFERRAL TO PHARMACIST     Referral to the pharmacist: Not needed      [facility]     Shipping address confirmed in Epic.       Delivery Scheduled: Yes, Expected medication delivery date: 10/18/22.     Medication will be delivered via Next Day Courier to the prescription address in Epic WAM.    Sherral Hammers, PharmD   [facility] Specialty Pharmacist

## 2022-10-07 NOTE — Unmapped (Signed)
Roberta Bird 's RINVOQ 15 mg Tb24 (upadacitinib) shipment will be sent out  as a result of pt needs medication filled sooner.    I have reached out to the patient  at [phone] and communicated the delivery change. We will reschedule the medication for the delivery date that the patient agreed upon.  We have confirmed the delivery date as 10/12/2022, via next day courier.

## 2022-10-11 MED FILL — RINVOQ 15 MG TABLET,EXTENDED RELEASE: ORAL | 30 days supply | Qty: 30 | Fill #6

## 2022-11-02 NOTE — Unmapped (Signed)
[facility] Refill Coordination Note    Specialty Medication(s) to be Shipped:   Inflammatory Disorders: Rinvoq    Other medication(s) to be shipped: No additional medications requested for fill at this time     Roberta Bird, DOB: [birth year]  Phone: [phone] (home)       All above HIPAA information was verified with patient.     Was a Nurse, learning disability used for this call? No    Completed refill call assessment today to schedule patient's medication shipment from the [facility] [phone]).  All relevant notes have been reviewed.     Specialty medication(s) and dose(s) confirmed: Regimen is correct and unchanged.   Changes to medications: [name] reports no changes at this time.  Changes to insurance: No  New side effects reported not previously addressed with a pharmacist or physician: None reported  Questions for the pharmacist: No    Confirmed patient received a Conservation officer, historic buildings and a Surveyor, mining with first shipment. The patient will receive a drug information handout for each medication shipped and additional FDA Medication Guides as required.       DISEASE/MEDICATION-SPECIFIC INFORMATION        N/A    SPECIALTY MEDICATION ADHERENCE     Medication Adherence    Patient reported X missed doses in the last month: 0  Specialty Medication: RINVOQ 15 mg Tb24 (upadacitinib)  Patient is on additional specialty medications: No              Were doses missed due to medication being on hold? No    Rinvoq 15 mg: 12 days of medicine on hand        REFERRAL TO PHARMACIST     Referral to the pharmacist: Not needed      [facility]     Shipping address confirmed in Epic.       Delivery Scheduled: Yes, Expected medication delivery date: 11/10/22.     Medication will be delivered via Next Day Courier to the prescription address in Epic WAM.    Willette Pa   [facility] Specialty Technician

## 2022-11-09 MED FILL — RINVOQ 15 MG TABLET,EXTENDED RELEASE: ORAL | 30 days supply | Qty: 30 | Fill #7

## 2022-12-01 NOTE — Unmapped (Signed)
[facility] Refill Coordination Note    Specialty Medication(s) to be Shipped:   Inflammatory Disorders: Rinvoq    Other medication(s) to be shipped: No additional medications requested for fill at this time     Roberta Bird, DOB: 09-12-1970  Phone: [phone] (home)       All above HIPAA information was verified with patient.     Was a Nurse, learning disability used for this call? No    Completed refill call assessment today to schedule patient's medication shipment from the [facility] [phone]).  All relevant notes have been reviewed.     Specialty medication(s) and dose(s) confirmed: Regimen is correct and unchanged.   Changes to medications: Dorit reports no changes at this time.  Changes to insurance: No  New side effects reported not previously addressed with a pharmacist or physician: None reported  Questions for the pharmacist: No    Confirmed patient received a Conservation officer, historic buildings and a Surveyor, mining with first shipment. The patient will receive a drug information handout for each medication shipped and additional FDA Medication Guides as required.       DISEASE/MEDICATION-SPECIFIC INFORMATION        N/A    SPECIALTY MEDICATION ADHERENCE     Medication Adherence    Patient reported X missed doses in the last month: 0  Specialty Medication: RINVOQ 15 mg Tb24 (upadacitinib)  Patient is on additional specialty medications: No              Were doses missed due to medication being on hold? No    Rinvoq 15 mg: 13 days of medicine on hand       REFERRAL TO PHARMACIST     Referral to the pharmacist: Not needed      [facility]     Shipping address confirmed in Epic.       Delivery Scheduled: Yes, Expected medication delivery date: 12/13/22.     Medication will be delivered via Next Day Courier to the prescription address in Epic WAM.    Quintella Reichert   [facility] Specialty Technician

## 2022-12-12 MED FILL — RINVOQ 15 MG TABLET,EXTENDED RELEASE: ORAL | 30 days supply | Qty: 30 | Fill #8

## 2023-01-04 NOTE — Unmapped (Signed)
[facility] Specialty and Home Delivery Pharmacy Refill Coordination Note    Specialty Medication(s) to be Shipped:   Inflammatory Disorders: Rinvoq    Other medication(s) to be shipped: No additional medications requested for fill at this time     Roberta Bird, DOB: 1970-07-31  Phone: [phone] (home)       All above HIPAA information was verified with patient.     Was a Nurse, learning disability used for this call? No    Completed refill call assessment today to schedule patient's medication shipment from the [facility] Delivery [facility]  [phone]).  All relevant notes have been reviewed.     Specialty medication(s) and dose(s) confirmed: Regimen is correct and unchanged.   Changes to medications: Janisha reports no changes at this time.  Changes to insurance: No  New side effects reported not previously addressed with a pharmacist or physician: None reported  Questions for the pharmacist: No    Confirmed patient received a Conservation officer, historic buildings and a Surveyor, mining with first shipment. The patient will receive a drug information handout for each medication shipped and additional FDA Medication Guides as required.       DISEASE/MEDICATION-SPECIFIC INFORMATION        N/A    SPECIALTY MEDICATION ADHERENCE     Medication Adherence    Patient reported X missed doses in the last month: 0  Specialty Medication: RINVOQ 15 mg Tb24 (upadacitinib)  Patient is on additional specialty medications: No              Were doses missed due to medication being on hold? No    Rinvoq 15 mg: 9 days of medicine on hand     REFERRAL TO PHARMACIST     Referral to the pharmacist: Not needed      [facility]     Shipping address confirmed in Epic.       Delivery Scheduled: Yes, Expected medication delivery date: 01/11/23.     Medication will be delivered via Same Day Courier to the prescription address in Epic WAM.    Quintella Reichert   [facility] Specialty and Home Delivery Pharmacy  Specialty Technician

## 2023-01-11 MED FILL — RINVOQ 15 MG TABLET,EXTENDED RELEASE: ORAL | 30 days supply | Qty: 30 | Fill #9

## 2023-01-30 DIAGNOSIS — M0609 Rheumatoid arthritis without rheumatoid factor, multiple sites: Principal | ICD-10-CM

## 2023-02-01 NOTE — Unmapped (Signed)
[facility] Specialty and Home Delivery Pharmacy Refill Coordination Note    Specialty Medication(s) to be Shipped:   Inflammatory Disorders: Rinvoq    Other medication(s) to be shipped: No additional medications requested for fill at this time     Roberta Bird, DOB: 1971/02/07  Phone: [phone] (home)       All above HIPAA information was verified with patient.     Was a Nurse, learning disability used for this call? No    Completed refill call assessment today to schedule patient's medication shipment from the [facility]  [phone]).  All relevant notes have been reviewed.     Specialty medication(s) and dose(s) confirmed: Regimen is correct and unchanged.   Changes to medications: Dlila reports stopping the following medications: methotrexate  Changes to insurance: No  New side effects reported not previously addressed with a pharmacist or physician: None reported  Questions for the pharmacist: No    Confirmed patient received a Conservation officer, historic buildings and a Surveyor, mining with first shipment. The patient will receive a drug information handout for each medication shipped and additional FDA Medication Guides as required.       DISEASE/MEDICATION-SPECIFIC INFORMATION        N/A    SPECIALTY MEDICATION ADHERENCE     Medication Adherence    Patient reported X missed doses in the last month: 0  Specialty Medication: RINVOQ 15 mg Tb24 (upadacitinib)  Patient is on additional specialty medications: No  Patient is on more than two specialty medications: No  Any gaps in refill history greater than 2 weeks in the last 3 months: no  Demonstrates understanding of importance of adherence: yes  Informant: patient  Reliability of informant: reliable  Provider-estimated medication adherence level: good  Patient is at risk for Non-Adherence: No  Reasons for non-adherence: no problems identified  Confirmed plan for next specialty medication refill: delivery by pharmacy  Refills needed for supportive medications: not needed          Refill Coordination    Has the Patients' Contact Information Changed: No  Is the Shipping Address Different: No         Were doses missed due to medication being on hold? No    rinvoq 15mg : 10 days of medicine on hand       REFERRAL TO PHARMACIST     Referral to the pharmacist: Not needed      [facility]     Shipping address confirmed in Epic.       Delivery Scheduled: Yes, Expected medication delivery date: 10/23.     Medication will be delivered via Next Day Courier to the prescription address in Epic WAM.    Antonietta Barcelona   [facility] Specialty and Home Delivery Pharmacy  Specialty Technician

## 2023-02-07 MED FILL — RINVOQ 15 MG TABLET,EXTENDED RELEASE: ORAL | 30 days supply | Qty: 30 | Fill #10

## 2023-03-01 NOTE — Unmapped (Signed)
[facility] Specialty and Home Delivery Pharmacy Clinical Assessment & Refill Coordination Note    Roberta Bird, DOB: [birth year]  Phone: [phone] (home)     All above HIPAA information was verified with patient.     Was a Nurse, learning disability used for this call? No    Specialty Medication(s):   Inflammatory Disorders: Rinvoq     Current Outpatient Medications   Medication Sig Dispense Refill    upadacitinib (RINVOQ) 15 mg Tb24 Take 1 tablet (15 mg total) by mouth daily. 30 tablet 11     No current facility-administered medications for this visit.        Changes to medications: Roberta Bird reports no changes at this time.    Allergies   Allergen Reactions    Ceclor [Cefaclor]     Imitrex [Sumatriptan]     Latex, Natural Rubber        Changes to allergies: No    SPECIALTY MEDICATION ADHERENCE     Rinvoq 15 mg: 8 days of medicine on hand     Medication Adherence    Patient reported X missed doses in the last month: 0  Specialty Medication: Rinvoq 15mg   Informant: patient  Confirmed plan for next specialty medication refill: delivery by pharmacy  Refills needed for supportive medications: not needed          Specialty medication(s) dose(s) confirmed: Regimen is correct and unchanged.     Are there any concerns with adherence? No    Adherence counseling provided? Not needed    CLINICAL MANAGEMENT AND INTERVENTION      Clinical Benefit Assessment:    Do you feel the medicine is effective or helping your condition? Yes    Clinical Benefit counseling provided? Not needed    Adverse Effects Assessment:    Are you experiencing any side effects? No    Are you experiencing difficulty administering your medicine? No    Quality of Life Assessment:    Quality of Life    Rheumatology  1. What impact has your specialty medication had on the reduction of your daily pain level?: Some  2. What impact has your specialty medication had on your ability to complete daily tasks (prepare meals, get dressed, etc...)?: Some  Oncology  Dermatology  Cystic Fibrosis          How many days over the past month did your condition  keep you from your normal activities? For example, brushing your teeth or getting up in the morning. 0    Have you discussed this with your provider? Not needed    Acute Infection Status:    Acute infections noted within Epic:  No active infections  Patient reported infection: None    Therapy Appropriateness:    Is therapy appropriate based on current medication list, adverse reactions, adherence, clinical benefit and progress toward achieving therapeutic goals? Yes, therapy is appropriate and should be continued     DISEASE/MEDICATION-SPECIFIC INFORMATION      N/A    Chronic Inflammatory Diseases: Have you experienced any flares in the last month? No  Has this been reported to your provider? Not applicable    PATIENT SPECIFIC NEEDS     Does the patient have any physical, cognitive, or cultural barriers? No    Is the patient high risk? No    Did the patient require a clinical intervention? No    Does the patient require physician intervention or other additional services (i.e., nutrition, smoking cessation, social work)? No    SOCIAL DETERMINANTS OF  HEALTH     At the [facility], we have learned that life circumstances - like trouble affording food, housing, utilities, or transportation can affect the health of many of our patients.   That is why we wanted to ask: are you currently experiencing any life circumstances that are negatively impacting your health and/or quality of life? Patient declined to answer    Social Determinants of Health     Food Insecurity: Unknown (01/25/2022)    Received from [facility]    Hunger Vital Sign     Worried About Running Out of Food in the Last Year: Patient refused     Ran Out of Food in the Last Year: Patient refused   Internet Connectivity: Not on file   Housing/Utilities: Not on file   Tobacco Use: Medium Risk (03/17/2022)    Received from [facility]    Patient History     Smoking Tobacco Use: Former     Smokeless Tobacco Use: Never     Passive Exposure: Not on file   Transportation Needs: Unknown (01/25/2022)    Received from [facility]    [facility] - Transportation     Lack of Transportation (Medical): Patient refused     Lack of Transportation (Non-Medical): Patient refused   Alcohol Use: Not on file   Interpersonal Safety: Unknown (03/01/2023)    Interpersonal Safety     Unsafe Where You Currently Live: Not on file     Physically Hurt by Anyone: Not on file     Abused by Anyone: Not on file   Physical Activity: Not on file   Intimate Partner Violence: Not on file   Stress: Not on file   Substance Use: Not on file (03/01/2023)   Social Connections: Not on file   Financial Resource Strain: Unknown (01/25/2022)    Received from [facility]    Overall Financial Resource Strain (CARDIA)     Difficulty of Paying Living Expenses: Patient refused   Depression: Not at risk (01/25/2022)    Received from [facility]    PHQ-2     Total Score =: 0   Health Literacy: Not on file       Would you be willing to receive help with any of the needs that you have identified today? Not applicable       SHIPPING     Specialty Medication(s) to be Shipped:   Inflammatory Disorders: Rinvoq    Other medication(s) to be shipped: No additional medications requested for fill at this time     Changes to insurance: No    Delivery Scheduled: Yes, Expected medication delivery date: 03/08/2023.     Medication will be delivered via Next Day Courier to the confirmed prescription address in [facility].    The patient will receive a drug information handout for each medication shipped and additional FDA Medication Guides as required.  Verified that patient has previously received a Conservation officer, historic buildings and a Surveyor, mining.    The patient or caregiver noted above participated in the development of this care plan and knows that they can request review of or adjustments to the care plan at any time.      All of the patient's questions and concerns have been addressed.    [name], PharmD   [facility] Specialty and Home Delivery Pharmacy Specialty Pharmacist

## 2023-03-07 MED FILL — RINVOQ 15 MG TABLET,EXTENDED RELEASE: ORAL | 30 days supply | Qty: 30 | Fill #11

## 2023-03-31 MED ORDER — RINVOQ 15 MG TABLET,EXTENDED RELEASE
ORAL_TABLET | Freq: Every day | ORAL | 11 refills | 30.00 days
Start: 2023-03-31 — End: ?

## 2023-04-04 MED ORDER — RINVOQ 15 MG TABLET,EXTENDED RELEASE
ORAL_TABLET | Freq: Every day | ORAL | 1 refills | 90.00 days
Start: 2023-04-04 — End: ?

## 2023-04-05 NOTE — Unmapped (Signed)
[facility] Specialty and Home Delivery Pharmacy Refill Coordination Note    Specialty Medication(s) to be Shipped:   Inflammatory Disorders: Rinvoq    Other medication(s) to be shipped: No additional medications requested for fill at this time     Roberta Bird, DOB: 1970/10/01  Phone: [phone] (home)       All above HIPAA information was verified with patient.     Was a Nurse, learning disability used for this call? No    Completed refill call assessment today to schedule patient's medication shipment from the [facility] Delivery [facility]  [phone]).  All relevant notes have been reviewed.     Specialty medication(s) and dose(s) confirmed: Regimen is correct and unchanged.   Changes to medications: Roberta Bird reports no changes at this time.  Changes to insurance: No  New side effects reported not previously addressed with a pharmacist or physician: None reported  Questions for the pharmacist: No    Confirmed patient received a Conservation officer, historic buildings and a Surveyor, mining with first shipment. The patient will receive a drug information handout for each medication shipped and additional FDA Medication Guides as required.       DISEASE/MEDICATION-SPECIFIC INFORMATION        N/A    SPECIALTY MEDICATION ADHERENCE     Medication Adherence    Patient reported X missed doses in the last month: 0  Specialty Medication: RINVOQ 15 mg Tb24 (upadacitinib)  Patient is on additional specialty medications: No              Were doses missed due to medication being on hold? No    Rinvoq 15 mg: 10 days of medicine on hand       REFERRAL TO PHARMACIST     Referral to the pharmacist: Not needed      [facility]     Shipping address confirmed in Epic.       Delivery Scheduled: Yes, Expected medication delivery date: 04/10/2023.     Medication will be delivered via Same Day Courier to the prescription address in Epic WAM.    Roberta Bird   [facility] Specialty and Home Delivery Pharmacy  Specialty Technician

## 2023-04-10 MED FILL — RINVOQ 15 MG TABLET,EXTENDED RELEASE: ORAL | 30 days supply | Qty: 30 | Fill #0

## 2023-05-05 NOTE — Unmapped (Signed)
[facility] Specialty and Home Delivery Pharmacy Refill Coordination Note    Specialty Medication(s) to be Shipped:   Inflammatory Disorders: Rinvoq    Other medication(s) to be shipped: No additional medications requested for fill at this time     Roberta Bird, DOB: [birth year]  Phone: [phone] (home)       All above HIPAA information was verified with patient.     Was a Nurse, learning disability used for this call? No    Completed refill call assessment today to schedule patient's medication shipment from the [facility] and [facility] Delivery Pharm[facility]  [phone]).  All relevant notes have been reviewed.     Specialty medication(s) and dose(s) confirmed: Regimen is correct and unchanged.   Changes to medications: [name] reports starting the following medications: methotrexate and folic acid  Changes to insurance: No  New side effects reported not previously addressed with a pharmacist or physician: None reported  Questions for the pharmacist: No    Confirmed patient received a Conservation officer, historic buildings and a Surveyor, mining with first shipment. The patient will receive a drug information handout for each medication shipped and additional FDA Medication Guides as required.       DISEASE/MEDICATION-SPECIFIC INFORMATION        N/A    SPECIALTY MEDICATION ADHERENCE     Medication Adherence    Patient reported X missed doses in the last month: 0  Specialty Medication: upadacitinib: RINVOQ 15 mg Tb24  Patient is on additional specialty medications: No  Informant: patient              Were doses missed due to medication being on hold? No     upadacitinib: RINVOQ 15 mg Tb24: 8 days of medicine on hand       REFERRAL TO PHARMACIST     Referral to the pharmacist: Not needed      [facility]     Shipping address confirmed in Epic.       Delivery Scheduled: Yes, Expected medication delivery date: 05/10/23.     Medication will be delivered via Same Day Courier to the prescription address in Epic WAM.    Craige Cotta   [facility] Specialty and Home Delivery Pharmacy  Specialty Technician

## 2023-05-10 NOTE — Unmapped (Signed)
Roberta Bird 's rinvoq shipment will be delayed as a result of weather     I have spoken with the patient  at [phone]  and communicated the delay. We will reschedule the medication for the delivery date that the patient agreed upon.  We have confirmed the delivery date as 05/11/2023

## 2023-05-11 MED FILL — RINVOQ 15 MG TABLET,EXTENDED RELEASE: ORAL | 30 days supply | Qty: 30 | Fill #1

## 2023-05-31 NOTE — Unmapped (Signed)
[facility] Specialty and Home Delivery Pharmacy Refill Coordination Note    Specialty Medication(s) to be Shipped:   Inflammatory Disorders: Rinvoq    Other medication(s) to be shipped: No additional medications requested for fill at this time     Roberta Bird, DOB: 1970-09-16  Phone: [phone] (home)       All above HIPAA information was verified with patient.     Was a Nurse, learning disability used for this call? No    Completed refill call assessment today to schedule patient's medication shipment from the [facility] and Home Delivery Pharmacy  [phone]).  All relevant notes have been reviewed.     Specialty medication(s) and dose(s) confirmed: Regimen is correct and unchanged.   Changes to medications: Caleen reports no changes at this time.  Changes to insurance: No  New side effects reported not previously addressed with a pharmacist or physician: None reported  Questions for the pharmacist: No    Confirmed patient received a Conservation officer, historic buildings and a Surveyor, mining with first shipment. The patient will receive a drug information handout for each medication shipped and additional FDA Medication Guides as required.       DISEASE/MEDICATION-SPECIFIC INFORMATION        N/A    SPECIALTY MEDICATION ADHERENCE              Were doses missed due to medication being on hold? No    Rinvoq 15 mg: 10 days of medicine on hand       REFERRAL TO PHARMACIST     Referral to the pharmacist: Not needed      [facility]     Shipping address confirmed in Epic.       Delivery Scheduled: Yes, Expected medication delivery date: 06/06/2023.     Medication will be delivered via UPS to the prescription address in Epic WAM.    Roberta Bird, PharmD   [facility] Specialty and Home Delivery Pharmacy  Specialty Pharmacist

## 2023-06-05 MED FILL — RINVOQ 15 MG TABLET,EXTENDED RELEASE: ORAL | 30 days supply | Qty: 30 | Fill #2

## 2023-06-26 NOTE — Unmapped (Signed)
 [facility] Specialty and Home Delivery Pharmacy Refill Coordination Note    Specialty Medication(s) to be Shipped:   Inflammatory Disorders: Rinvoq    Other medication(s) to be shipped: No additional medications requested for fill at this time     Roberta Bird, DOB: [birth year]  Phone: [phone] (home)       All above HIPAA information was verified with patient.     Was a Nurse, learning disability used for this call? No    Completed refill call assessment today to schedule patient's medication shipment from the [facility] and Home Delivery Pharmacy  [phone]).  All relevant notes have been reviewed.     Specialty medication(s) and dose(s) confirmed: Regimen is correct and unchanged.   Changes to medications: Chanon reports no changes at this time.  Changes to insurance: No  New side effects reported not previously addressed with a pharmacist or physician: None reported  Questions for the pharmacist: No    Confirmed patient received a Conservation officer, historic buildings and a Surveyor, mining with first shipment. The patient will receive a drug information handout for each medication shipped and additional FDA Medication Guides as required.       DISEASE/MEDICATION-SPECIFIC INFORMATION        N/A    SPECIALTY MEDICATION ADHERENCE              Were doses missed due to medication being on hold? No    Rinvoq 15 mg: 16 days of medicine on hand     REFERRAL TO PHARMACIST     Referral to the pharmacist: Not needed      [facility]     Shipping address confirmed in Epic.     Cost and Payment: Patient has a $0 copay, payment information is not required.    Delivery Scheduled: Yes, Expected medication delivery date: 07/06/2023.     Medication will be delivered via Next Day Courier to the prescription address in Epic WAM.    [name], PharmD   [facility] Specialty and Home Delivery Pharmacy  Specialty Pharmacist

## 2023-07-05 MED FILL — RINVOQ 15 MG TABLET,EXTENDED RELEASE: ORAL | 30 days supply | Qty: 30 | Fill #3

## 2023-07-31 NOTE — Unmapped (Signed)
 [facility] Specialty and Home Delivery Pharmacy Refill Coordination Note    Specialty Medication(s) to be Shipped:   Inflammatory Disorders: Rinvoq    Other medication(s) to be shipped: No additional medications requested for fill at this time     Roberta Bird, DOB: 1970/07/29  Phone: [phone] (home)       All above HIPAA information was verified with patient.     Was a Nurse, learning disability used for this call? No    Completed refill call assessment today to schedule patient's medication shipment from the [facility] and Home Delivery Pharmacy  [phone]).  All relevant notes have been reviewed.     Specialty medication(s) and dose(s) confirmed: Regimen is correct and unchanged.   Changes to medications: Latausha reports no changes at this time.  Changes to insurance: No  New side effects reported not previously addressed with a pharmacist or physician: None reported  Questions for the pharmacist: No    Confirmed patient received a Conservation officer, historic buildings and a Surveyor, mining with first shipment. The patient will receive a drug information handout for each medication shipped and additional FDA Medication Guides as required.       DISEASE/MEDICATION-SPECIFIC INFORMATION        N/A    SPECIALTY MEDICATION ADHERENCE     Medication Adherence    Patient reported X missed doses in the last month: 0  Specialty Medication: RINVOQ 15 mg Tb24 (upadacitinib)  Patient is on additional specialty medications: No  Patient is on more than two specialty medications: No              Were doses missed due to medication being on hold? No    RINVOQ 15 mg Tb24 (upadacitinib) : 9 days of medicine on hand       REFERRAL TO PHARMACIST     Referral to the pharmacist: Not needed      [facility]     Shipping address confirmed in Epic.     Cost and Payment: Patient has a $0 copay, payment information is not required.    Delivery Scheduled: Yes, Expected medication delivery date: 08/03/23.     Medication will be delivered via Same Day Courier to the prescription address in Epic WAM.    [name]   [facility] Specialty and Home Delivery Pharmacy  Specialty Technician

## 2023-08-03 MED FILL — RINVOQ 15 MG TABLET,EXTENDED RELEASE: ORAL | 30 days supply | Qty: 30 | Fill #4

## 2023-09-04 NOTE — Unmapped (Signed)
 [facility] Specialty and Home Delivery Pharmacy Refill Coordination Note    Specialty Medication(s) to be Shipped:   Inflammatory Disorders: Rinvoq     Other medication(s) to be shipped: No additional medications requested for fill at this time     Roberta Bird, DOB: [birth year]  Phone: [phone] (home)       All above HIPAA information was verified with patient.     Was a Nurse, learning disability used for this call? No    Completed refill call assessment today to schedule patient's medication shipment from the [facility] and Home Delivery Pharmacy  [phone]).  All relevant notes have been reviewed.     Specialty medication(s) and dose(s) confirmed: Regimen is correct and unchanged.   Changes to medications: Roberta Bird reports no changes at this time.  Changes to insurance: No  New side effects reported not previously addressed with a pharmacist or physician: None reported  Questions for the pharmacist: No    Confirmed patient received a Conservation officer, historic buildings and a Surveyor, mining with first shipment. The patient will receive a drug information handout for each medication shipped and additional FDA Medication Guides as required.       DISEASE/MEDICATION-SPECIFIC INFORMATION        N/A    SPECIALTY MEDICATION ADHERENCE     Medication Adherence    Patient reported X missed doses in the last month: 0  Specialty Medication: RINVOQ  15 mg Tb24 (upadacitinib )  Patient is on additional specialty medications: No              Were doses missed due to medication being on hold? No     RINVOQ  15 mg Tb24 (upadacitinib ): 6 days of medicine on hand       REFERRAL TO PHARMACIST     Referral to the pharmacist: Not needed      [facility]     Shipping address confirmed in Epic.     Cost and Payment: Patient has a $0 copay, payment information is not required.    Delivery Scheduled: Yes, Expected medication delivery date: 09/06/23.     Medication will be delivered via Same Day Courier to the prescription address in Epic WAM.    Arno Lapidus [facility] Specialty and Home Delivery Pharmacy  Specialty Technician

## 2023-09-06 MED FILL — RINVOQ 15 MG TABLET,EXTENDED RELEASE: ORAL | 30 days supply | Qty: 30 | Fill #5

## 2023-09-29 MED ORDER — RINVOQ 15 MG TABLET,EXTENDED RELEASE
ORAL_TABLET | Freq: Every day | ORAL | 1 refills | 90.00000 days | Status: CN
Start: 2023-09-29 — End: ?

## 2023-09-29 NOTE — Unmapped (Signed)
 [facility] Specialty and Home Delivery Pharmacy Refill Coordination Note    Specialty Medication(s) to be Shipped:   Inflammatory Disorders: Rinvoq     Other medication(s) to be shipped: No additional medications requested for fill at this time     Roberta Bird, DOB: Dec 10, 1970  Phone: [phone] (home)       All above HIPAA information was verified with patient.     Was a Nurse, learning disability used for this call? No    Completed refill call assessment today to schedule patient's medication shipment from the [facility] and Home Delivery Pharmacy  [phone]).  All relevant notes have been reviewed.     Specialty medication(s) and dose(s) confirmed: Regimen is correct and unchanged.   Changes to medications: Roberta Bird reports no changes at this time.  Changes to insurance: No  New side effects reported not previously addressed with a pharmacist or physician: None reported  Questions for the pharmacist: No    Confirmed patient received a Conservation officer, historic buildings and a Surveyor, mining with first shipment. The patient will receive a drug information handout for each medication shipped and additional FDA Medication Guides as required.       DISEASE/MEDICATION-SPECIFIC INFORMATION        N/A    SPECIALTY MEDICATION ADHERENCE     Medication Adherence    Patient reported X missed doses in the last month: 0  Specialty Medication: RINVOQ  15 mg Tb24 (upadacitinib )  Patient is on additional specialty medications: No              Were doses missed due to medication being on hold? No     RINVOQ  15 mg Tb24 (upadacitinib ): 8 days of medicine on hand       REFERRAL TO PHARMACIST     Referral to the pharmacist: Not needed      [facility]     Shipping address confirmed in Epic.     Cost and Payment: Patient has a $0 copay, payment information is not required.    Delivery Scheduled: Yes, Expected medication delivery date: 10/04/2023.     Medication will be delivered via Next Day Courier to the prescription address in Epic WAM.    Lanny Plan [facility] Specialty and Home Delivery Pharmacy  Specialty Technician

## 2023-10-02 MED ORDER — RINVOQ 15 MG TABLET,EXTENDED RELEASE
ORAL_TABLET | Freq: Every day | ORAL | 1 refills | 90.00000 days
Start: 2023-10-02 — End: ?

## 2023-10-03 MED FILL — RINVOQ 15 MG TABLET,EXTENDED RELEASE: ORAL | 30 days supply | Qty: 30 | Fill #0

## 2023-11-03 NOTE — Unmapped (Signed)
 [facility] Specialty and Home Delivery Pharmacy Refill Coordination Note    Specialty Medication(s) to be Shipped:   Inflammatory Disorders: Rinvoq     Other medication(s) to be shipped: No additional medications requested for fill at this time     Roberta Bird, DOB: 1970/11/17  Phone: [phone] (home)       All above HIPAA information was verified with patient.     Was a Nurse, learning disability used for this call? No    Completed refill call assessment today to schedule patient's medication shipment from the [facility] and Home Delivery Pharmacy  [phone]).  All relevant notes have been reviewed.     Specialty medication(s) and dose(s) confirmed: Regimen is correct and unchanged.   Changes to medications: [name] reports no changes at this time.  Changes to insurance: No  New side effects reported not previously addressed with a pharmacist or physician: None reported  Questions for the pharmacist: No    Confirmed patient received a Conservation officer, historic buildings and a Surveyor, mining with first shipment. The patient will receive a drug information handout for each medication shipped and additional FDA Medication Guides as required.       DISEASE/MEDICATION-SPECIFIC INFORMATION        N/A    SPECIALTY MEDICATION ADHERENCE     Medication Adherence    Patient reported X missed doses in the last month: 0  Specialty Medication: upadacitinib : RINVOQ  15 mg Tb24  Patient is on additional specialty medications: No  Patient is on more than two specialty medications: No  Any gaps in refill history greater than 2 weeks in the last 3 months: no  Demonstrates understanding of importance of adherence: yes              Were doses missed due to medication being on hold? No     RINVOQ  15 mg Tb24 (upadacitinib ): 6 days of medicine on hand       REFERRAL TO PHARMACIST     Referral to the pharmacist: Not needed      [facility]     Shipping address confirmed in Epic.     Cost and Payment: Patient has a $0 copay, payment information is not required.    Delivery Scheduled: Yes, Expected medication delivery date: 07/212025.     Medication will be delivered via Same Day Courier to the prescription address in Epic WAM.    Tawni Daring   [facility]  Specialty Technician

## 2023-11-06 MED FILL — RINVOQ 15 MG TABLET,EXTENDED RELEASE: ORAL | 30 days supply | Qty: 30 | Fill #1

## 2023-11-27 NOTE — Unmapped (Signed)
 [facility] Specialty and Home Delivery Pharmacy Refill Coordination Note    Specialty Medication(s) to be Shipped:   Inflammatory Disorders: Rinvoq     Other medication(s) to be shipped: No additional medications requested for fill at this time     Roberta Bird, DOB: Mar 29, 1971  Phone: [phone] (home)       All above HIPAA information was verified with patient.     Was a Nurse, learning disability used for this call? No    Completed refill call assessment today to schedule patient's medication shipment from the [facility] and Home Delivery Pharmacy  [phone]).  All relevant notes have been reviewed.     Specialty medication(s) and dose(s) confirmed: Regimen is correct and unchanged.   Changes to medications: [name] reports no changes at this time.  Changes to insurance: No  New side effects reported not previously addressed with a pharmacist or physician: None reported  Questions for the pharmacist: No    Confirmed patient received a Conservation officer, historic buildings and a Surveyor, mining with first shipment. The patient will receive a drug information handout for each medication shipped and additional FDA Medication Guides as required.       DISEASE/MEDICATION-SPECIFIC INFORMATION        N/A    SPECIALTY MEDICATION ADHERENCE     Medication Adherence    Patient reported X missed doses in the last month: 0  Specialty Medication: upadacitinib : RINVOQ  15 mg Tb24  Patient is on additional specialty medications: No  Patient is on more than two specialty medications: No  Any gaps in refill history greater than 2 weeks in the last 3 months: no  Demonstrates understanding of importance of adherence: yes  Informant: patient  Reliability of informant: reliable  Provider-estimated medication adherence level: good  Patient is at risk for Non-Adherence: No  Reasons for non-adherence: no problems identified  Confirmed plan for next specialty medication refill: delivery by pharmacy  Refills needed for supportive medications: not needed Refill Coordination    Has the Patients' Contact Information Changed: No  Is the Shipping Address Different: No         Were doses missed due to medication being on hold? No     RINVOQ  15 mg Tb24 (upadacitinib ): 12 days of medicine on hand       REFERRAL TO PHARMACIST     Referral to the pharmacist: Not needed      [facility]     Shipping address confirmed in Epic.     Cost and Payment: Patient has a $0 copay, payment information is not required.    Delivery Scheduled: Yes, Expected medication delivery date: 08/19.     Medication will be delivered via Next Day Courier to the prescription address in Epic WAM.    Roberta Bird   [facility] Specialty and Home Delivery Pharmacy  Specialty Technician

## 2023-12-04 MED FILL — RINVOQ 15 MG TABLET,EXTENDED RELEASE: ORAL | 30 days supply | Qty: 30 | Fill #2

## 2023-12-26 NOTE — Unmapped (Signed)
 12/26/2023 - Clinical assessment date was due on 01/10/2024. Reached out to [facility] and got approval to proceed with scheduling the patients refill and to move the clinical assessment date to match the next refill coordination.    [facility] Specialty and Home Delivery Pharmacy Refill Coordination Note    Specialty Medication(s) to be Shipped:   Inflammatory Disorders: Rinvoq     Other medication(s) to be shipped: No additional medications requested for fill at this time    Specialty Medications not needed at this time: N/A     Roberta Bird, DOB: [birth year]  Phone: [phone] (home)       All above HIPAA information was verified with patient.     Was a Nurse, learning disability used for this call? No    Completed refill call assessment today to schedule patient's medication shipment from the [facility] and Home Delivery Pharmacy  [phone]).  All relevant notes have been reviewed.     Specialty medication(s) and dose(s) confirmed: Regimen is correct and unchanged.   Changes to medications: Roberta Bird reports no changes at this time.  Changes to insurance: No  New side effects reported not previously addressed with a pharmacist or physician: None reported  Questions for the pharmacist: No    Confirmed patient received a Conservation officer, historic buildings and a Surveyor, mining with first shipment. The patient will receive a drug information handout for each medication shipped and additional FDA Medication Guides as required.       DISEASE/MEDICATION-SPECIFIC INFORMATION        N/A    SPECIALTY MEDICATION ADHERENCE     Medication Adherence    Patient reported X missed doses in the last month: 0  Specialty Medication: RINVOQ  15 mg Tb24 (upadacitinib )  Patient is on additional specialty medications: No              Were doses missed due to medication being on hold? No      RINVOQ  15 mg Tb24 (upadacitinib ): 13 days of medicine on hand       REFERRAL TO PHARMACIST     Referral to the pharmacist: Not needed      [facility]     Shipping address confirmed in Epic.     Cost and Payment: Patient has a $0 copay, payment information is not required.    Delivery Scheduled: Yes, Expected medication delivery date: 01/04/2024.     Medication will be delivered via Same Day Courier to the prescription address in Epic WAM.     Roberta Bird   [facility] Specialty and Home Delivery Pharmacy  Specialty Technician

## 2024-01-04 MED FILL — RINVOQ 15 MG TABLET,EXTENDED RELEASE: ORAL | 30 days supply | Qty: 30 | Fill #3

## 2024-01-24 NOTE — Unmapped (Signed)
 [facility] Specialty and Home Delivery Pharmacy Clinical Assessment & Refill Coordination Note    Roberta Bird, DOB: 06-28-1970  Phone: [phone] (home)     All above HIPAA information was verified with patient.     Was a Nurse, learning disability used for this call? No    Specialty Medication(s):   Inflammatory Disorders: Rinvoq      Current Medications[1]     Changes to medications: Negin reports no changes at this time.    Medication list has been reviewed and updated in Epic: Yes    Allergies[2]    Changes to allergies: No    Allergies have been reviewed and updated in Epic: Yes    SPECIALTY MEDICATION ADHERENCE     Rinvoq  15 mg: 14 days of medicine on hand     Medication Adherence    Patient reported X missed doses in the last month: 0  Specialty Medication: Rinvoq  15mg   Informant: patient  Confirmed plan for next specialty medication refill: delivery by pharmacy  Refills needed for supportive medications: not needed          Specialty medication(s) dose(s) confirmed: Regimen is correct and unchanged.     Are there any concerns with adherence? No    Adherence counseling provided? Not needed    CLINICAL MANAGEMENT AND INTERVENTION      Clinical Benefit Assessment:    Do you feel the medicine is effective or helping your condition? Yes    Clinical Benefit counseling provided? Not needed    Adverse Effects Assessment:    Are you experiencing any side effects? No    Are you experiencing difficulty administering your medicine? No    Quality of Life Assessment:    Quality of Life    Rheumatology  1. What impact has your specialty medication had on the reduction of your daily pain level?: Some  2. What impact has your specialty medication had on your ability to complete daily tasks (prepare meals, get dressed, etc...)?: Some  Oncology  Dermatology  Cystic Fibrosis          How many days over the past month did your condition  keep you from your normal activities? For example, brushing your teeth or getting up in the morning. 0    Have you discussed this with your provider? Not needed    Acute Infection Status:    Acute infections noted within Epic:  No active infections    Patient reported infection: None    Therapy Appropriateness:    Is the medication and dose appropriate considering the patient???s diagnosis, treatment, and disease journey, comorbidities, medical history, current medications, allergies, therapeutic goals, self-administration ability, and access barriers? Yes, therapy is appropriate and should be continued     Clinical Intervention:    Was an intervention completed as part of this clinical assessment? No    DISEASE/MEDICATION-SPECIFIC INFORMATION      N/A    Chronic Inflammatory Diseases: Have you experienced any flares in the last month? No  Has this been reported to your provider? No    PATIENT SPECIFIC NEEDS     Does the patient have any physical, cognitive, or cultural barriers? No    Is the patient high risk? No    Does the patient require physician intervention or other additional services (i.e., nutrition, smoking cessation, social work)? No    Does the patient have an additional or emergency contact listed in their chart? Yes    SOCIAL DETERMINANTS OF HEALTH     At the [facility], we have learned that life circumstances - like trouble affording food, housing, utilities, or transportation can affect the health of many of our patients.   That is why we wanted to ask: are you currently experiencing any life circumstances that are negatively impacting your health and/or quality of life? No    Social Drivers of Health     Food Insecurity: Unknown (01/25/2022)    Received from [facility]    Hunger Vital Sign     Worried About Running Out of Food in the Last Year: Patient refused     Ran Out of Food in the Last Year: Patient refused   Tobacco Use: Medium Risk (03/17/2022)    Received from [facility]    Patient History     Smoking Tobacco Use: Former     Smokeless Tobacco Use: Never Passive Exposure: Not on file   Transportation Needs: Unknown (01/25/2022)    Received from [facility]    PRAPARE - Transportation     Lack of Transportation (Medical): Patient refused     Lack of Transportation (Non-Medical): Patient refused   Alcohol Use: Not on file   Housing: Unknown (01/25/2022)    Received from [facility]    Housing Stability Vital Sign     Unable to Pay for Housing in the Last Year: Patient refused     Number of Places Lived in the Last Year: Not on file     In the last 12 months, was there a time when you did not have a steady place to sleep or slept in a shelter (including now)?: Patient refused   Physical Activity: Not on file   Utilities: Not on file   Stress: Not on file   Interpersonal Safety: Not on file   Substance Use: Not on file (05/10/2023)   Intimate Partner Violence: Not on file   Social Connections: Not on file   Financial Resource Strain: Unknown (01/25/2022)    Received from [facility]    Overall Financial Resource Strain (CARDIA)     Difficulty of Paying Living Expenses: Patient refused   Health Literacy: Not on file   Internet Connectivity: Not on file       Would you be willing to receive help with any of the needs that you have identified today? No       SHIPPING     Specialty Medication(s) to be Shipped:   Inflammatory Disorders: Rinvoq     Other medication(s) to be shipped: No additional medications requested for fill at this time    Specialty Medications not needed at this time: N/A     Changes to insurance: No    Cost and Payment: Patient has a $0 copay, payment information is not required.    Delivery Scheduled: Yes, Expected medication delivery date: 01/31/2024.     Medication will be delivered via Same Day Courier to the confirmed prescription address in [facility].    The patient will receive a drug information handout for each medication shipped and additional FDA Medication Guides as required.  Verified that patient has previously received a Conservation officer, historic buildings and a Surveyor, mining.    The patient or caregiver noted above participated in the development of this care plan and knows that they can request review of or adjustments to the care plan at any time.      All of the patient's questions and concerns have been addressed.  Velma Ober, PharmD   [facility] Specialty and Home Delivery Pharmacy Specialty Pharmacist       [1]   Current Outpatient Medications   Medication Sig Dispense Refill    upadacitinib  (RINVOQ ) 15 mg Tb24 Take 1 tablet (15 mg total) by mouth once daily 90 tablet 1     No current facility-administered medications for this visit.   [2]   Allergies  Allergen Reactions    Ceclor [Cefaclor]     Imitrex [Sumatriptan]     Latex, Freight forwarder

## 2024-01-25 MED ORDER — RINVOQ 15 MG TABLET,EXTENDED RELEASE
ORAL_TABLET | Freq: Every day | ORAL | 1 refills | 90.00000 days
Start: 2024-01-25 — End: ?

## 2024-01-30 MED FILL — RINVOQ 15 MG TABLET,EXTENDED RELEASE: ORAL | 30 days supply | Qty: 30 | Fill #4

## 2024-02-28 NOTE — Progress Notes (Signed)
 [facility] Refill Coordination Note    Specialty Medication(s) to be Shipped:   Inflammatory Disorders: Rinvoq     Other medication(s) to be shipped: No additional medications requested for fill at this time    Specialty Medications not needed at this time: N/A     Roberta Bird, DOB: 01-Mar-1971  Phone: [phone] (home)       All above HIPAA information was verified with patient.     Was a nurse, learning disability used for this call? No    Completed refill call assessment today to schedule patient's medication shipment from the [facility]  [phone]).  All relevant notes have been reviewed.     Specialty medication(s) and dose(s) confirmed: Regimen is correct and unchanged.   Changes to medications: Roberta Bird reports no changes at this time.  Changes to insurance: No  New side effects reported not previously addressed with a pharmacist or physician: None reported  Questions for the pharmacist: No    Confirmed patient received a Conservation Officer, Historic Buildings and a Surveyor, Mining with first shipment. The patient will receive a drug information handout for each medication shipped and additional FDA Medication Guides as required.       DISEASE/MEDICATION-SPECIFIC INFORMATION        N/A    SPECIALTY MEDICATION ADHERENCE     Medication Adherence    Patient reported X missed doses in the last month: 0  Specialty Medication: upadacitinib : RINVOQ  15 mg Tb24  Patient is on additional specialty medications: No              Were doses missed due to medication being on hold? No    Rinvoq  15 mg: 9 days of medicine on hand        REFERRAL TO PHARMACIST     Referral to the pharmacist: Not needed      [facility]     Shipping address confirmed in Epic.     Cost and Payment: Patient has a $0 copay, payment information is not required.    Delivery Scheduled: Yes, Expected medication delivery date: 03/04/24.     Medication will be delivered via Same Day Courier to the prescription address in Epic [place].    Kelly CHRISTELLA Eagles   [facility]  Specialty Technician

## 2024-03-04 MED FILL — RINVOQ 15 MG TABLET,EXTENDED RELEASE: ORAL | 30 days supply | Qty: 30 | Fill #5

## 2024-03-27 NOTE — Progress Notes (Signed)
 [facility] Refill Coordination Note    Specialty Medication(s) to be Shipped:   Inflammatory Disorders: Rinvoq     Other medication(s) to be shipped: No additional medications requested for fill at this time    Specialty Medications not needed at this time: N/A     Roberta Bird, DOB: 10-12-1970  Phone: [phone] (home)       All above HIPAA information was verified with patient.     Was a nurse, learning disability used for this call? No    Completed refill call assessment today to schedule patient's medication shipment from the [facility]  [phone]).  All relevant notes have been reviewed.     Specialty medication(s) and dose(s) confirmed: Regimen is correct and unchanged.   Changes to medications: Daiya reports no changes at this time.  Changes to insurance: No  New side effects reported not previously addressed with a pharmacist or physician: None reported  Questions for the pharmacist: No    Confirmed patient received a Conservation Officer, Historic Buildings and a Surveyor, Mining with first shipment. The patient will receive a drug information handout for each medication shipped and additional FDA Medication Guides as required.       DISEASE/MEDICATION-SPECIFIC INFORMATION        N/A    SPECIALTY MEDICATION ADHERENCE     Medication Adherence    Patient reported X missed doses in the last month: 0  Specialty Medication: upadacitinib : RINVOQ  15 mg Tb24  Patient is on additional specialty medications: No  Patient is on more than two specialty medications: No  Any gaps in refill history greater than 2 weeks in the last 3 months: no  Demonstrates understanding of importance of adherence: yes              Were doses missed due to medication being on hold? No        upadacitinib : RINVOQ  15 mg Tb24: 11  days of medicine on hand         REFERRAL TO PHARMACIST     Referral to the pharmacist: Not needed      [facility]     Shipping address confirmed in Epic.     Cost and Payment: Patient has a $0 copay, payment information is not required.    Delivery Scheduled: Yes, Expected medication delivery date: 04/02/24 .     Medication will be delivered via UPS to the prescription address in Epic [place].    Tawni Daring   [facility]  Specialty Technician

## 2024-04-01 MED FILL — RINVOQ 15 MG TABLET,EXTENDED RELEASE: ORAL | 30 days supply | Qty: 30 | Fill #0

## 2024-04-29 NOTE — Progress Notes (Signed)
 [facility] Refill Coordination Note    Specialty Medication(s) to be Shipped:   Inflammatory Disorders: Roberta Bird     Other medication(s) to be shipped: No additional medications requested for fill at this time    Specialty Medications not needed at this time: N/A     Roberta Bird, DOB: 07-05-1970  Phone: [phone] (home)       All above HIPAA information was verified with patient.     Was a nurse, learning disability used for this call? No    Completed refill call assessment today to schedule patient's medication shipment from the [facility]  [phone]).  All relevant notes have been reviewed.     Specialty medication(s) and dose(s) confirmed: Regimen is correct and unchanged.   Changes to medications: Jearldean reports no changes at this time.  Changes to insurance: No  New side effects reported not previously addressed with a pharmacist or physician: None reported  Questions for the pharmacist: No    Confirmed patient received a Conservation Officer, Historic Buildings and a Surveyor, Mining with first shipment. The patient will receive a drug information handout for each medication shipped and additional FDA Medication Guides as required.       DISEASE/MEDICATION-SPECIFIC INFORMATION        N/A    SPECIALTY MEDICATION ADHERENCE     Medication Adherence    Specialty Medication: Roberta Bird  15 mg Tb24 (upadacitinib )  Patient is on additional specialty medications: No              Were doses missed due to medication being on hold? No      : Roberta Bird  15 mg Tb24 (upadacitinib ) 8 days of medicine on hand       Specialty medication is an injection or given on a cycle: No    REFERRAL TO PHARMACIST     Referral to the pharmacist: Not needed      [facility]     Shipping address confirmed in Epic.     Cost and Payment: Patient has a $0 copay, payment information is not required.    Delivery Scheduled: Yes, Expected medication delivery date: 05/03/24.     Medication will be delivered via Next Day Courier to the prescription address in Epic [place].    [facility]  Specialty Technician

## 2024-05-02 MED FILL — RINVOQ 15 MG TABLET,EXTENDED RELEASE: ORAL | 30 days supply | Qty: 30 | Fill #1
